# Patient Record
Sex: Male | Born: 1985
Health system: Southern US, Community
[De-identification: ages and names within clinical notes are randomized; demographics above are authoritative.]

## PROBLEM LIST (undated history)

## (undated) DIAGNOSIS — J40 Bronchitis, not specified as acute or chronic: Secondary | ICD-10-CM

## (undated) DIAGNOSIS — K8020 Calculus of gallbladder without cholecystitis without obstruction: Secondary | ICD-10-CM

## (undated) HISTORY — PX: APPENDECTOMY: SHX54

## (undated) HISTORY — PX: CHOLECYSTECTOMY: SHX55

---

## 2010-01-24 ENCOUNTER — Emergency Department (HOSPITAL_BASED_OUTPATIENT_CLINIC_OR_DEPARTMENT_OTHER)
Admission: EM | Admit: 2010-01-24 | Discharge: 2010-01-24 | Payer: Self-pay | Source: Home / Self Care | Admitting: Emergency Medicine

## 2010-05-27 ENCOUNTER — Emergency Department (HOSPITAL_BASED_OUTPATIENT_CLINIC_OR_DEPARTMENT_OTHER)
Admission: EM | Admit: 2010-05-27 | Discharge: 2010-05-27 | Disposition: A | Discharge status: Home / Self Care | Payer: BC Managed Care – PPO | Attending: Emergency Medicine | Admitting: Emergency Medicine

## 2010-05-27 DIAGNOSIS — R109 Unspecified abdominal pain: Secondary | ICD-10-CM | POA: Insufficient documentation

## 2010-05-27 DIAGNOSIS — IMO0002 Reserved for concepts with insufficient information to code with codable children: Secondary | ICD-10-CM | POA: Insufficient documentation

## 2010-05-27 DIAGNOSIS — Y9289 Other specified places as the place of occurrence of the external cause: Secondary | ICD-10-CM | POA: Insufficient documentation

## 2010-05-27 DIAGNOSIS — X503XXA Overexertion from repetitive movements, initial encounter: Secondary | ICD-10-CM | POA: Insufficient documentation

## 2010-05-27 DIAGNOSIS — F17200 Nicotine dependence, unspecified, uncomplicated: Secondary | ICD-10-CM | POA: Insufficient documentation

## 2010-08-17 ENCOUNTER — Emergency Department (HOSPITAL_BASED_OUTPATIENT_CLINIC_OR_DEPARTMENT_OTHER)
Admission: EM | Admit: 2010-08-17 | Discharge: 2010-08-17 | Disposition: A | Discharge status: Home / Self Care | Payer: BC Managed Care – PPO | Attending: Emergency Medicine | Admitting: Emergency Medicine

## 2010-08-17 ENCOUNTER — Encounter: Payer: Self-pay | Admitting: *Deleted

## 2010-08-17 ENCOUNTER — Emergency Department (INDEPENDENT_AMBULATORY_CARE_PROVIDER_SITE_OTHER): Payer: BC Managed Care – PPO

## 2010-08-17 DIAGNOSIS — R05 Cough: Secondary | ICD-10-CM | POA: Insufficient documentation

## 2010-08-17 DIAGNOSIS — R059 Cough, unspecified: Secondary | ICD-10-CM | POA: Insufficient documentation

## 2010-08-17 DIAGNOSIS — J40 Bronchitis, not specified as acute or chronic: Secondary | ICD-10-CM | POA: Insufficient documentation

## 2010-08-17 DIAGNOSIS — J029 Acute pharyngitis, unspecified: Secondary | ICD-10-CM | POA: Insufficient documentation

## 2010-08-17 DIAGNOSIS — Z87891 Personal history of nicotine dependence: Secondary | ICD-10-CM

## 2010-08-17 DIAGNOSIS — J3489 Other specified disorders of nose and nasal sinuses: Secondary | ICD-10-CM

## 2010-08-17 MED ORDER — DOXYCYCLINE HYCLATE 100 MG PO CAPS: 100.0000 mg | ORAL_CAPSULE | Freq: Two times a day (BID) | ORAL | Status: AC

## 2010-08-17 MED ORDER — ALBUTEROL SULFATE HFA 108 (90 BASE) MCG/ACT IN AERS: 1.0000 | INHALATION_SPRAY | Freq: Four times a day (QID) | RESPIRATORY_TRACT | Status: DC | PRN

## 2010-08-17 NOTE — ED Provider Notes (Signed)
History     CSN: [id number] Arrival date & time: 08/17/2010 10:02 AM  Chief Complaint  Patient presents with  . Sore Throat  . Cough   Patient is a 25 y.o. male presenting with pharyngitis and cough. The history is provided by the patient. No language interpreter was used.  Sore Throat This is a new problem. The current episode started more than 1 week ago. The problem occurs constantly. The problem has not changed since onset.Pertinent negatives include no chest pain, no abdominal pain, no headaches and no shortness of breath. The symptoms are aggravated by nothing. The symptoms are relieved by nothing. He has tried nothing for the symptoms. The treatment provided no relief.  Cough Pertinent negatives include no chest pain, no chills, no headaches, no shortness of breath and no wheezing.  No f/c/r. No CP, DOE, no SOB. No n/v/d.  No long car trips or plane trips, no swelling of the lower extremities.   History reviewed. No pertinent past medical history.  History reviewed. No pertinent past surgical history.  History reviewed. No pertinent family history.  History  Substance Use Topics  . Smoking status: Current Some Day Smoker  . Smokeless tobacco: Former Neurosurgeon  . Alcohol Use: Yes     occ      Review of Systems  Constitutional: Negative for fever, chills and activity change.  HENT: Negative for congestion, facial swelling, neck pain, neck stiffness and tinnitus.   Respiratory: Positive for cough. Negative for shortness of breath, wheezing and stridor.   Cardiovascular: Negative for chest pain.  Gastrointestinal: Negative for abdominal pain and abdominal distention.  Genitourinary: Negative for difficulty urinating.  Musculoskeletal: Negative.   Skin: Negative.   Neurological: Negative for headaches.  Hematological: Negative.   Psychiatric/Behavioral: Negative.     Physical Exam  BP 129/76  Pulse 73  Temp(Src) 98.4 F (36.9 C) (Oral)  Resp 18  SpO2 100%  Physical  Exam  Constitutional: He is oriented to person, place, and time. He appears well-developed and well-nourished. No distress.  HENT:  Head: Normocephalic and atraumatic.  Mouth/Throat: Oropharynx is clear and moist. No oropharyngeal exudate.  Eyes: EOM are normal. Pupils are equal, round, and reactive to light.  Neck: Normal range of motion. Neck supple.  Cardiovascular: Normal rate and regular rhythm.   Pulmonary/Chest: Effort normal and breath sounds normal. No respiratory distress. He has no wheezes. He has no rales.  Abdominal: Soft. Bowel sounds are normal. He exhibits no distension.  Musculoskeletal: Normal range of motion.  Neurological: He is alert and oriented to person, place, and time.  Skin: Skin is warm and dry.  Psychiatric: He has a normal mood and affect.    ED Course  Procedures  MDM Return for fevers, chills, inability to swallow, worsening cough or weakness or any concerns.  Follow up with your family doctor      Tysin Salada K Andreya Lacks-Rasch, MD 08/17/10 1122

## 2010-08-20 ENCOUNTER — Emergency Department (HOSPITAL_BASED_OUTPATIENT_CLINIC_OR_DEPARTMENT_OTHER)
Admission: EM | Admit: 2010-08-20 | Discharge: 2010-08-20 | Disposition: A | Discharge status: Home / Self Care | Payer: BC Managed Care – PPO | Attending: Emergency Medicine | Admitting: Emergency Medicine

## 2010-08-20 ENCOUNTER — Encounter (HOSPITAL_BASED_OUTPATIENT_CLINIC_OR_DEPARTMENT_OTHER): Payer: Self-pay

## 2010-08-20 DIAGNOSIS — J069 Acute upper respiratory infection, unspecified: Secondary | ICD-10-CM | POA: Insufficient documentation

## 2010-08-20 HISTORY — DX: Bronchitis, not specified as acute or chronic: J40

## 2010-08-20 LAB — DIFFERENTIAL
Basophils Absolute: 0 10*3/uL (ref 0.0–0.1)
Eosinophils Absolute: 0.7 10*3/uL (ref 0.0–0.7)
Eosinophils Relative: 13 % — ABNORMAL HIGH (ref 0–5)

## 2010-08-20 LAB — CBC
MCH: 31.4 pg (ref 26.0–34.0)
MCV: 88.6 fL (ref 78.0–100.0)
Platelets: 120 10*3/uL — ABNORMAL LOW (ref 150–400)
RDW: 11.7 % (ref 11.5–15.5)
WBC: 5.3 10*3/uL (ref 4.0–10.5)

## 2010-08-20 MED ORDER — BENZONATATE 100 MG PO CAPS: 100.0000 mg | ORAL_CAPSULE | Freq: Three times a day (TID) | ORAL | Status: AC

## 2010-08-20 NOTE — ED Notes (Signed)
Difficulty breathing and cough.  Seen here Tuesday and diagnosed with Bronchitis.

## 2010-08-24 NOTE — ED Provider Notes (Signed)
History     CSN: [id number] Arrival date & time: 08/20/2010  4:26 PM  No chief complaint on file.  HPI Comments: Patient seen here on 8/7 for same.   He started using albuterol and doxycycline but notes he has actually coughed more though his symptoms are otherwise improved.  He does continue to be short of breath but also reports that this is still an improvement.  CXR at previous visit showed no PNA.  Patient is a 25 y.o. male presenting with cough. The history is provided by the patient. No language interpreter was used.  Cough This is a recurrent problem. The current episode started more than 2 days ago. The problem occurs hourly. The problem has not changed since onset.The cough is non-productive. There has been no fever. Associated symptoms include headaches and shortness of breath. The treatment provided no relief. He is a smoker. His past medical history is significant for bronchitis.    Past Medical History  Diagnosis Date  . Bronchitis     Past Surgical History  Procedure Date  . Appendectomy     History reviewed. No pertinent family history.  History  Substance Use Topics  . Smoking status: Current Some Day Smoker -- 0.5 packs/day  . Smokeless tobacco: Former Neurosurgeon  . Alcohol Use: Yes     occ      Review of Systems  Constitutional: Negative.   Eyes: Negative.   Respiratory: Positive for cough and shortness of breath.   Cardiovascular: Negative.   Gastrointestinal: Negative.   Genitourinary: Negative.   Musculoskeletal: Negative.   Skin: Negative.   Neurological: Positive for headaches.  Hematological: Negative.   Psychiatric/Behavioral: Negative.   All other systems reviewed and are negative.    Physical Exam  BP 117/66  Pulse 66  Temp(Src) 98.2 F (36.8 C) (Oral)  Resp 18  Ht 6' (1.829 m)  Wt 175 lb (79.379 kg)  BMI 23.73 kg/m2  SpO2 97%  Physical Exam  Nursing note and vitals reviewed. Constitutional: He is oriented to person, place, and  time. He appears well-developed and well-nourished. No distress.  HENT:  Head: Normocephalic and atraumatic.  Mouth/Throat: Oropharynx is clear and moist. No oropharyngeal exudate.  Eyes: Conjunctivae and EOM are normal. Pupils are equal, round, and reactive to light.  Neck: Normal range of motion.  Cardiovascular: Normal rate and regular rhythm.  Exam reveals no gallop and no friction rub.   No murmur heard. Pulmonary/Chest: Effort normal and breath sounds normal. No respiratory distress. He has no wheezes. He has no rales.  Abdominal: Soft. Bowel sounds are normal. He exhibits no distension. There is no tenderness. There is no rebound and no guarding.  Musculoskeletal: Normal range of motion. He exhibits no edema and no tenderness.  Neurological: He is alert and oriented to person, place, and time. No cranial nerve deficit. He exhibits normal muscle tone. Coordination normal.  Skin: Skin is warm and dry. No rash noted.  Psychiatric: He has a normal mood and affect.    ED Course  Procedures  MDM Patient had not worsened from his previous visit and actually described improvement.  He was concerned that he continued to have shortness of breath and cough.  CBC demonstrated no anemia or signs of other process that might indicate an underlying ailment such as leukemia or lymphoma.  Patient and I discussed that albuterol though it eases chest tightness can often actually increase coughing.  Patient was less concerned once he learned this.  I did prescribe  tessalon perles for his cough and told him to continue his antibiotics.  Patient was comfortable with discharge home and was discharged in good condition.  A: 25 yo M continuing to recover from bronchitis with cough.  P: As per above.     Cyndra Numbers, MD 08/24/10 [phone]

## 2012-01-07 ENCOUNTER — Encounter (HOSPITAL_BASED_OUTPATIENT_CLINIC_OR_DEPARTMENT_OTHER): Payer: Self-pay | Admitting: *Deleted

## 2012-01-07 ENCOUNTER — Emergency Department (HOSPITAL_BASED_OUTPATIENT_CLINIC_OR_DEPARTMENT_OTHER)
Admission: EM | Admit: 2012-01-07 | Discharge: 2012-01-08 | Disposition: A | Discharge status: Home / Self Care | Payer: BC Managed Care – PPO | Attending: Emergency Medicine | Admitting: Emergency Medicine

## 2012-01-07 DIAGNOSIS — R197 Diarrhea, unspecified: Secondary | ICD-10-CM | POA: Insufficient documentation

## 2012-01-07 DIAGNOSIS — F17200 Nicotine dependence, unspecified, uncomplicated: Secondary | ICD-10-CM | POA: Insufficient documentation

## 2012-01-07 DIAGNOSIS — R112 Nausea with vomiting, unspecified: Secondary | ICD-10-CM | POA: Insufficient documentation

## 2012-01-07 DIAGNOSIS — K2990 Gastroduodenitis, unspecified, without bleeding: Secondary | ICD-10-CM | POA: Insufficient documentation

## 2012-01-07 DIAGNOSIS — K2970 Gastritis, unspecified, without bleeding: Secondary | ICD-10-CM | POA: Insufficient documentation

## 2012-01-07 DIAGNOSIS — IMO0002 Reserved for concepts with insufficient information to code with codable children: Secondary | ICD-10-CM | POA: Insufficient documentation

## 2012-01-07 DIAGNOSIS — Z8709 Personal history of other diseases of the respiratory system: Secondary | ICD-10-CM | POA: Insufficient documentation

## 2012-01-07 DIAGNOSIS — Z8719 Personal history of other diseases of the digestive system: Secondary | ICD-10-CM | POA: Insufficient documentation

## 2012-01-07 HISTORY — DX: Calculus of gallbladder without cholecystitis without obstruction: K80.20

## 2012-01-07 LAB — CBC WITH DIFFERENTIAL/PLATELET
Basophils Absolute: 0 10*3/uL (ref 0.0–0.1)
Eosinophils Absolute: 0.4 10*3/uL (ref 0.0–0.7)
Lymphocytes Relative: 8 % — ABNORMAL LOW (ref 12–46)
MCHC: 36.1 g/dL — ABNORMAL HIGH (ref 30.0–36.0)
Neutro Abs: 7.6 10*3/uL (ref 1.7–7.7)
Neutrophils Relative %: 82 % — ABNORMAL HIGH (ref 43–77)
RDW: 11.9 % (ref 11.5–15.5)

## 2012-01-07 LAB — URINALYSIS, ROUTINE W REFLEX MICROSCOPIC
Ketones, ur: NEGATIVE mg/dL
Leukocytes, UA: NEGATIVE
Nitrite: NEGATIVE
Protein, ur: NEGATIVE mg/dL
Urobilinogen, UA: 0.2 mg/dL (ref 0.0–1.0)

## 2012-01-07 LAB — COMPREHENSIVE METABOLIC PANEL
Albumin: 4.6 g/dL (ref 3.5–5.2)
Alkaline Phosphatase: 73 U/L (ref 39–117)
BUN: 20 mg/dL (ref 6–23)
Potassium: 4.8 mEq/L (ref 3.5–5.1)
Sodium: 141 mEq/L (ref 135–145)
Total Protein: 7.7 g/dL (ref 6.0–8.3)

## 2012-01-07 LAB — LIPASE, BLOOD: Lipase: 31 U/L (ref 11–59)

## 2012-01-07 NOTE — ED Notes (Signed)
Pt reports generalized body aches.

## 2012-01-07 NOTE — ED Notes (Signed)
Mid abd pain onset today. Vomited x 1. +nausea.

## 2012-01-07 NOTE — ED Provider Notes (Signed)
History   This chart was scribed for [name], MD scribed by Magnus Sinning. The patient was seen in room MH11/MH11 at 23:51    CSN: [id number]  Arrival date & time 01/07/12  2235    Chief Complaint  Patient presents with  . Abdominal Pain    (Consider location/radiation/quality/duration/timing/severity/associated sxs/prior treatment) Patient is a 26 y.o. male presenting with abdominal pain. The history is provided by the patient. No language interpreter was used.  Abdominal Pain The primary symptoms of the illness include abdominal pain, nausea and vomiting. The primary symptoms of the illness do not include fever, fatigue, diarrhea or dysuria. The current episode started 13 to 24 hours ago. The onset of the illness was gradual. The problem has not changed since onset. The abdominal pain began 13 to24 hours ago. The pain came on gradually. The abdominal pain has been unchanged since its onset. The abdominal pain is located in the epigastric region. The abdominal pain does not radiate. The abdominal pain is relieved by nothing.  The vomiting began yesterday. Vomiting occurred once. The emesis contains stomach contents.  The illness is associated with alcohol use. Symptoms associated with the illness do not include chills or constipation. Significant associated medical issues do not include PUD.   Blake Baldwin is a 26 y.o. male who presents to the Emergency Department complaining of steady and slightly gradually worsening moderate mid abd pain that began today with one episode of emesis,nausea today. No f/c/r No weight loss. The pt denies diarrhea, but says he does lack appetite. He provides exposure to babysitter that has had respiratory infection and two ear infections, as well as, exposure to sick one-year at home.  He states he had last episode of emesis three hours ago and that he had a mild HA previously that was relieved with tylenol.   Past Medical History  Diagnosis  Date  . Bronchitis   . Gallstones     Past Surgical History  Procedure Date  . Appendectomy     History reviewed. No pertinent family history.  History  Substance Use Topics  . Smoking status: Current Some Day Smoker -- 0.5 packs/day  . Smokeless tobacco: Former Neurosurgeon  . Alcohol Use: Yes     Comment: occ      Review of Systems  Constitutional: Negative for fever, chills, fatigue and unexpected weight change.  HENT: Positive for rhinorrhea. Negative for ear pain, congestion, neck pain and neck stiffness.   Eyes: Negative for itching.  Respiratory: Negative for cough.   Gastrointestinal: Positive for nausea, vomiting and abdominal pain. Negative for diarrhea and constipation.  Genitourinary: Negative for dysuria and flank pain.  Neurological: Negative for dizziness.  Hematological: Negative for adenopathy.  All other systems reviewed and are negative.    Allergies  Dilaudid  Home Medications   Current Outpatient Rx  Name  Route  Sig  Dispense  Refill  . ALBUTEROL SULFATE HFA 108 (90 BASE) MCG/ACT IN AERS   Inhalation   Inhale 1-2 puffs into the lungs every 6 (six) hours as needed for wheezing.   1 Inhaler   0     BP 123/69  Pulse 106  Temp 98.2 F (36.8 C) (Oral)  Resp 16  Ht 6' (1.829 m)  Wt 170 lb (77.111 kg)  BMI 23.06 kg/m2  SpO2 100%  Physical Exam  Nursing note and vitals reviewed. Constitutional: He is oriented to person, place, and time. He appears well-developed and well-nourished. No distress.  HENT:  Head:  Normocephalic and atraumatic.  Mouth/Throat: Oropharynx is clear and moist. No oropharyngeal exudate.  Eyes: Conjunctivae normal and EOM are normal. Pupils are equal, round, and reactive to light.  Neck: Normal range of motion. Neck supple. No tracheal deviation present.       No supraclavicular no epitrochlear LAN  Cardiovascular: Normal rate, regular rhythm, normal heart sounds and intact distal pulses.   Pulmonary/Chest: Effort  normal. No respiratory distress. He has no wheezes. He has no rales.  Abdominal: Soft. Bowel sounds are normal. He exhibits no distension. There is no tenderness. There is no rebound and no guarding.  Musculoskeletal: Normal range of motion. He exhibits no edema.  Lymphadenopathy:    He has no cervical adenopathy.  Neurological: He is alert and oriented to person, place, and time. He has normal reflexes. No sensory deficit.  Skin: Skin is warm and dry.  Psychiatric: He has a normal mood and affect. His behavior is normal.    ED Course  Procedures (including critical care time) DIAGNOSTIC STUDIES: Oxygen Saturation is 100% on room air, normal by my interpretation.    COORDINATION OF CARE:    Labs Reviewed  CBC WITH DIFFERENTIAL - Abnormal; Notable for the following:    MCHC 36.1 (*)     Platelets 117 (*)     Neutrophils Relative 82 (*)     Lymphocytes Relative 8 (*)     All other components within normal limits  COMPREHENSIVE METABOLIC PANEL - Abnormal; Notable for the following:    Glucose, Bld 102 (*)     All other components within normal limits  URINALYSIS, ROUTINE W REFLEX MICROSCOPIC  LIPASE, BLOOD   No results found.   No diagnosis found.    MDM  Informed of need to follow up with ongoing care and further testing regarding platelets. Exam vitals and lab reassuring.  Patient asks EDP at DC if symptoms could be caused by ETOH.  EDP informed patient that alcohol can cause symptoms of gastritis.  Patient must follow up with a PCP for ongoing care Return for worsening symptoms.  Patient verbalizes understanding and agrees to follow up.  D/c all alcohol.    I personally performed the services described in this documentation, which was scribed in my presence. The recorded information has been reviewed and is accurate.          Jasmine Awe, MD 01/08/12 [phone]

## 2012-01-08 ENCOUNTER — Emergency Department (HOSPITAL_BASED_OUTPATIENT_CLINIC_OR_DEPARTMENT_OTHER): Payer: BC Managed Care – PPO

## 2012-01-08 MED ORDER — ONDANSETRON HCL 4 MG/2ML IJ SOLN
4.0000 mg | Freq: Once | INTRAMUSCULAR | Status: AC
  Administered 2012-01-08: 4 mg via INTRAVENOUS
  Filled 2012-01-08: qty 2

## 2012-01-08 MED ORDER — GI COCKTAIL ~~LOC~~
30.0000 mL | Freq: Once | ORAL | Status: AC
  Administered 2012-01-08: 30 mL via ORAL
  Filled 2012-01-08: qty 30

## 2012-01-08 MED ORDER — OMEPRAZOLE 20 MG PO CPDR: 20.0000 mg | DELAYED_RELEASE_CAPSULE | Freq: Every day | ORAL | Status: DC

## 2012-01-08 MED ORDER — KETOROLAC TROMETHAMINE 30 MG/ML IJ SOLN
30.0000 mg | Freq: Once | INTRAMUSCULAR | Status: AC
  Administered 2012-01-08: 30 mg via INTRAVENOUS
  Filled 2012-01-08: qty 1

## 2012-01-08 MED ORDER — ONDANSETRON 8 MG PO TBDP: ORAL_TABLET | ORAL | Status: DC

## 2012-01-18 ENCOUNTER — Encounter: Payer: Self-pay | Admitting: Family

## 2012-01-18 ENCOUNTER — Ambulatory Visit (INDEPENDENT_AMBULATORY_CARE_PROVIDER_SITE_OTHER): Payer: BC Managed Care – PPO | Admitting: Family

## 2012-01-18 VITALS — BP 110/72 | HR 76 | Temp 97.6°F | Resp 16 | Ht 72.5 in | Wt 173.0 lb

## 2012-01-18 DIAGNOSIS — Z720 Tobacco use: Secondary | ICD-10-CM

## 2012-01-18 DIAGNOSIS — Z23 Encounter for immunization: Secondary | ICD-10-CM

## 2012-01-18 DIAGNOSIS — Z0000 Encounter for general adult medical examination without abnormal findings: Secondary | ICD-10-CM

## 2012-01-18 DIAGNOSIS — D696 Thrombocytopenia, unspecified: Secondary | ICD-10-CM

## 2012-01-18 DIAGNOSIS — D3190 Benign neoplasm of unspecified part of unspecified eye: Secondary | ICD-10-CM

## 2012-01-18 DIAGNOSIS — F17200 Nicotine dependence, unspecified, uncomplicated: Secondary | ICD-10-CM

## 2012-01-18 DIAGNOSIS — Z87891 Personal history of nicotine dependence: Secondary | ICD-10-CM | POA: Insufficient documentation

## 2012-01-18 LAB — LIPID PANEL: HDL: 34 mg/dL — ABNORMAL LOW (ref >39)

## 2012-01-18 LAB — IRON AND TIBC
%SAT: 62 % — ABNORMAL HIGH (ref 20–55)
TIBC: 293 ug/dL (ref 215–435)
UIBC: 111 ug/dL — ABNORMAL LOW (ref 125–400)

## 2012-01-18 NOTE — Assessment & Plan Note (Signed)
Tdap today, discussed importance of healthy diet.  Obtain fasting labs.

## 2012-01-18 NOTE — Assessment & Plan Note (Signed)
Appears chronic.  Refer to hematology for further evaluation.  Mother requests that we check iron level today.

## 2012-01-18 NOTE — Patient Instructions (Addendum)
Please complete your lab work prior to leaving.  You will be contacted about your referral to hematology.  Please let us know if you have not heard back within 1 week about your referral. Follow up as needed.

## 2012-01-18 NOTE — Assessment & Plan Note (Signed)
Pt was counseled to quit smoking. He is currently not motivated to quit.

## 2012-01-18 NOTE — Progress Notes (Signed)
Subjective:    Patient ID: Blake Baldwin, male    DOB: [birth year], 27 y.o.   MRN: [id number]  HPI  Blake Baldwin is a 27 yr old male who presents today to establish care.    Pt went Saturday to ED due to vomitting.  Returned on Sunday for same. Diagnosed with gastroenteritis.  Reports symptoms resolved.  Incidental finding during ED visit of low platelet count.  Low Platelet count-  117K in ED.  Was checked 1 year ago and it was 120K.  Mother who comes today with the patient reports that her sister is currently undergoing evaluation for same.  Was followed by GI as a teenager for ? Crohns.  Per mother this was never confirmed. He denies GI issues as an adult outside of his recent gastroenteritis.  Preventative-  Reports that his diet is not healthy.   He declines flu shot. Due for Tetanus- agreeable to this He is active in his work- works for [employer], no formal exercise.  Tobacco abuse- Not motivated to quit  Review of Systems  Constitutional: Negative for unexpected weight change.  HENT: Negative for hearing loss.   Eyes: Negative for visual disturbance.  Respiratory:       "smokers cough"  Cardiovascular: Negative for leg swelling.  Gastrointestinal: Negative for nausea, vomiting and diarrhea.  Genitourinary: Negative for dysuria and frequency.  Musculoskeletal: Negative for arthralgias.  Skin: Negative for rash.  Neurological:       Occasional headaches  Hematological: Negative for adenopathy.  Psychiatric/Behavioral:       Denies anxiety, denies depression   Past Medical History  Diagnosis Date  . Bronchitis   . Gallstones     History   Social History  . Marital Status: Married    Spouse Name: N/A    Number of Children: N/A  . Years of Education: N/A   Occupational History  . Not on file.   Social History Main Topics  . Smoking status: Current Some Day Smoker  . Smokeless tobacco: Former Neurosurgeon     Comment: 5-6 cigarettes daily  . Alcohol Use: Yes   Comment: occ  . Drug Use: No  . Sexually Active: Not Currently   Other Topics Concern  . 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 Appendectomy     Family History  Problem Relation Age of Onset  . Hypertension Mother   . Other Mother     hyperaldosteronism and padgett's disease  . Asthma Mother   . Heart disease Maternal Aunt   . Diabetes Maternal Aunt   . Diabetes Maternal Uncle   . Diabetes Cousin     Allergies  Allergen Reactions  . Dilaudid (Hydromorphone Hcl) Itching and Rash    Current Outpatient Prescriptions on File Prior to Visit  Medication Sig Dispense Refill  . albuterol (PROVENTIL HFA;VENTOLIN HFA) 108 (90 BASE) MCG/ACT inhaler Inhale 1-2 puffs into the lungs every 6 (six) hours as needed for wheezing.  1 Inhaler  0  . omeprazole (PRILOSEC) 20 MG capsule Take 1 capsule (20 mg total) by mouth daily.  30 capsule  0  . ondansetron (ZOFRAN ODT) 8 MG disintegrating tablet 8mg  ODT q8 hours prn nausea  4 tablet  0    BP 110/72  Pulse 76  Temp 97.6 F (36.4 C) (Oral)  Resp 16  Ht 6' 0.5" (1.842 m)  Wt 173 lb (78.472 kg)  BMI 23.14 kg/m2  SpO2 99%       Objective:   Physical Exam  Physical Exam  Constitutional: He is oriented to person, place, and time. He appears well-developed and well-nourished. No distress.  HENT:  Head: Normocephalic and atraumatic.  Right Ear: Tympanic membrane and ear canal normal.  Left Ear: Tympanic membrane and ear canal normal.  Mouth/Throat: Oropharynx is clear and moist.  Eyes: Pupils are equal, round, and reactive to light. No scleral icterus.  Neck: Normal range of motion. No thyromegaly present.  Cardiovascular: Normal rate and regular rhythm.   No murmur heard. Pulmonary/Chest: Effort normal and breath sounds normal. No respiratory distress. He has no wheezes. He has no rales. He exhibits no tenderness.  Abdominal: Soft.  Bowel sounds are normal. He exhibits no distension and no mass. There is no tenderness. There is no rebound and no guarding.  Musculoskeletal: He exhibits no edema.  Lymphadenopathy:    He has no cervical adenopathy.  Neurological: He is alert and oriented to person, place, and time.  He exhibits normal muscle tone. Coordination normal.  Skin: Skin is warm and dry.  Psychiatric: He has a normal mood and affect. His behavior is normal. Judgment and thought content normal.          Assessment & Plan:         Assessment & Plan:

## 2012-01-18 NOTE — Assessment & Plan Note (Signed)
Will refer to opthalmology

## 2012-01-20 ENCOUNTER — Encounter: Payer: Self-pay | Admitting: Family

## 2012-02-04 ENCOUNTER — Ambulatory Visit (HOSPITAL_COMMUNITY): Admission: AD | Admit: 2012-02-04 | Payer: BC Managed Care – PPO | Source: Ambulatory Visit

## 2012-02-04 ENCOUNTER — Emergency Department (HOSPITAL_BASED_OUTPATIENT_CLINIC_OR_DEPARTMENT_OTHER)
Admission: EM | Admit: 2012-02-04 | Discharge: 2012-02-05 | Disposition: A | Discharge status: Home / Self Care | Payer: BC Managed Care – PPO | Attending: Emergency Medicine | Admitting: Emergency Medicine

## 2012-02-04 ENCOUNTER — Encounter (HOSPITAL_BASED_OUTPATIENT_CLINIC_OR_DEPARTMENT_OTHER): Payer: Self-pay | Admitting: *Deleted

## 2012-02-04 ENCOUNTER — Emergency Department (HOSPITAL_COMMUNITY): Payer: BC Managed Care – PPO

## 2012-02-04 DIAGNOSIS — F17200 Nicotine dependence, unspecified, uncomplicated: Secondary | ICD-10-CM | POA: Insufficient documentation

## 2012-02-04 DIAGNOSIS — N509 Disorder of male genital organs, unspecified: Secondary | ICD-10-CM | POA: Insufficient documentation

## 2012-02-04 DIAGNOSIS — N50819 Testicular pain, unspecified: Secondary | ICD-10-CM

## 2012-02-04 NOTE — ED Notes (Signed)
Pt describes left testicular pain "after doing strenuous activities of that nature". Denies urinary s/s.

## 2012-02-04 NOTE — ED Provider Notes (Signed)
History     CSN: [id number]  Arrival date & time 02/04/12  2135   First MD Initiated Contact with Patient 02/04/12 2153      Chief Complaint  Patient presents with  . Testicle Pain    (Consider location/radiation/quality/duration/timing/severity/associated sxs/prior treatment) HPI Comments: Patient is a 27 year old male who presents with sudden onset of left testicular pain 2 days ago. Patient reports the pain started during intercourse and has been constant since the onset. The pain is dull, aching and moderate. No aggravating/alleviating factors. Associated symptoms include a "busted blood vessel" on his left side of scrotum. Patient did not try anything for pain relief.    Past Medical History  Diagnosis Date  . Bronchitis   . Gallstones     Past Surgical History  Procedure Date  . Appendectomy     Family History  Problem Relation Age of Onset  . Hypertension Mother   . Other Mother     hyperaldosteronism and padgett's disease  . Asthma Mother   . Heart disease Maternal Aunt   . Diabetes Maternal Aunt   . Diabetes Maternal Uncle   . Diabetes Cousin     History  Substance Use Topics  . Smoking status: Current Some Day Smoker  . Smokeless tobacco: Former Neurosurgeon     Comment: 5-6 cigarettes daily  . Alcohol Use: Yes     Comment: occ      Review of Systems  Genitourinary: Positive for testicular pain.  All other systems reviewed and are negative.    Allergies  Dilaudid  Home Medications   Current Outpatient Rx  Name  Route  Sig  Dispense  Refill  . ALBUTEROL SULFATE HFA 108 (90 BASE) MCG/ACT IN AERS   Inhalation   Inhale 1-2 puffs into the lungs every 6 (six) hours as needed for wheezing.   1 Inhaler   0     BP 131/75  Pulse 88  Temp 98.2 F (36.8 C) (Oral)  Resp 18  Ht 6' (1.829 m)  Wt 173 lb (78.472 kg)  BMI 23.46 kg/m2  SpO2 100%  Physical Exam  Nursing note and vitals reviewed. Constitutional: He appears well-developed and  well-nourished. No distress.  HENT:  Head: Normocephalic and atraumatic.  Eyes: Conjunctivae normal are normal.  Neck: Normal range of motion. Neck supple.  Cardiovascular: Normal rate and regular rhythm.  Exam reveals no gallop and no friction rub.   No murmur heard. Pulmonary/Chest: Effort normal and breath sounds normal. He has no wheezes. He has no rales. He exhibits no tenderness.  Abdominal: Soft. He exhibits no distension. There is no tenderness. There is no rebound.  Genitourinary: Penis normal.       No testicular swelling noted. Left side superficial varicosity noted. Scrotum non tender to palpation.   Musculoskeletal: Normal range of motion.  Neurological: He is alert.       Speech is goal-oriented. Moves limbs without ataxia.   Skin: Skin is warm and dry.  Psychiatric: He has a normal mood and affect.    ED Course  Procedures (including critical care time)  Labs Reviewed - No data to display No results found.   1. Testicular pain       MDM  10:32 PM Patient instructed to go to [facility] for a scrotal ultrasound to evaluate for testicular torsion. Charge nurse notified.        Emilia Beck, PA-C 02/05/12 0002

## 2012-02-04 NOTE — ED Notes (Signed)
Pt transported via private vehicle to [facility] for [place] & evaluation. Lynden Ang, RN [facility] notified.

## 2012-02-04 NOTE — ED Notes (Signed)
ED PAC at bedside. 

## 2012-02-04 NOTE — ED Notes (Signed)
Spoke with Dr Read Drivers regarding the reason the patient was sent here from [facility].  Stated we could get the ultrasound done while still waiting in the waiting room.

## 2012-02-05 LAB — URINALYSIS, ROUTINE W REFLEX MICROSCOPIC
Glucose, UA: NEGATIVE mg/dL
Ketones, ur: NEGATIVE mg/dL
Leukocytes, UA: NEGATIVE
Specific Gravity, Urine: 1.033 — ABNORMAL HIGH (ref 1.005–1.030)
pH: 6 (ref 5.0–8.0)

## 2012-02-05 NOTE — ED Notes (Signed)
Patient returned from US at this time.

## 2012-02-05 NOTE — ED Provider Notes (Signed)
Medical screening examination/treatment/procedure(s) were performed by non-physician practitioner and as supervising physician I was immediately available for consultation/collaboration.   Brittni Hult, MD 02/05/12 1533 

## 2012-02-05 NOTE — ED Provider Notes (Signed)
Pt seen in the ED at [facility] after he received his testicular ultrasound He has no signs of torsion on physical exam, no signs of inguinal hernia His [place] is negative.  He feels well for discharge Advised to avoid intercourse for a week and referred to urology Urine sent to lab but pt would like to be discharged.  He does not want gc/chlamydia swab   Joya Gaskins, MD 02/05/12 [phone]

## 2012-02-09 ENCOUNTER — Ambulatory Visit: Payer: BC Managed Care – PPO | Admitting: Hematology & Oncology

## 2012-02-09 ENCOUNTER — Ambulatory Visit: Payer: BC Managed Care – PPO

## 2012-02-09 ENCOUNTER — Other Ambulatory Visit: Payer: BC Managed Care – PPO | Admitting: Lab

## 2012-02-10 ENCOUNTER — Other Ambulatory Visit (HOSPITAL_BASED_OUTPATIENT_CLINIC_OR_DEPARTMENT_OTHER): Payer: BC Managed Care – PPO | Admitting: Lab

## 2012-02-10 ENCOUNTER — Ambulatory Visit: Payer: BC Managed Care – PPO

## 2012-02-10 ENCOUNTER — Ambulatory Visit (HOSPITAL_BASED_OUTPATIENT_CLINIC_OR_DEPARTMENT_OTHER): Payer: BC Managed Care – PPO | Admitting: Hematology & Oncology

## 2012-02-10 VITALS — BP 132/78 | HR 67 | Temp 97.0°F | Resp 18 | Ht 72.0 in | Wt 175.0 lb

## 2012-02-10 DIAGNOSIS — F1020 Alcohol dependence, uncomplicated: Secondary | ICD-10-CM

## 2012-02-10 DIAGNOSIS — D696 Thrombocytopenia, unspecified: Secondary | ICD-10-CM

## 2012-02-10 LAB — CBC WITH DIFFERENTIAL (CANCER CENTER ONLY)
BASO#: 0 10*3/uL (ref 0.0–0.2)
HCT: 42.7 % (ref 38.7–49.9)
HGB: 14.8 g/dL (ref 13.0–17.1)
LYMPH#: 2.4 10*3/uL (ref 0.9–3.3)
MONO#: 0.6 10*3/uL (ref 0.1–0.9)
NEUT#: 4.2 10*3/uL (ref 1.5–6.5)
NEUT%: 56.6 % (ref 40.0–80.0)
RBC: 4.68 10*6/uL (ref 4.20–5.70)
WBC: 7.4 10*3/uL (ref 4.0–10.0)

## 2012-02-10 NOTE — Progress Notes (Signed)
This office note has been dictated.

## 2012-02-11 NOTE — Progress Notes (Signed)
CC:   Sandford Craze, NP  DIAGNOSIS:  Mild thrombocytopenia.  HISTORY OF PRESENT ILLNESS:  Blake Baldwin is a nice 27 year old white gentleman.  He works for [employer].  He comes in with his mom.  He is actually a pretty funny guy.  He is totally into skateboarding.  He does have 2 kids.  He and his wife unfortunately recently separated.  He is followed by Sandford Craze.  He has been noted to have some mild thrombocytopenia going back to August, 2012.  His platelet count was 120,000.  White cell count was 5.3.  He is not anemic.  In late December 2013, his CBC showed a white cell count of 9.3, hemoglobin 16.8, hematocrit 46.5, and platelet count 117.  He had a white cell differential of 82 segs and 8 lymphocytes.  He recently was seen because of, I think, nausea and vomiting.  I think he also had a possible bronchitis.  He has had normal metabolic studies.  BUN and creatinine were 20 and 0.8.  LFTs are normal.  He had a normal lipases.  He was kindly referred to the [facility] because of the thrombocytopenia.  He has had no bleeding.  There has been no bruising.  He has had no weight loss or weight gain.  There has been no change in bowel or bladder habits.  He has not noticed any joint problems.  He does a skateboard quite a bit.  He seems to be doing pretty well with this.  He reports that does he binge drink.  He says that he drinks a 5th of whiskey 2 or 3 times a month.  He does do, I think, some marijuana.  He also ingests psychotropic mushrooms.  Again, we were asked to see him for the thrombocytopenia.  PAST MEDICAL HISTORY:  Relatively unremarkable.  ALLERGIES:  Dilaudid.  MEDICATIONS:  None.  SOCIAL HISTORY:  Remarkable for a 1/2 pack per day of tobacco use.  He does have the binge alcohol drinking with hard liquor.  There is some recreational drug use but nothing along the lines of cocaine, etc.  FAMILY HISTORY:  Remarkable for, I  think, an aunt who may have had thrombocytopenia.  REVIEW OF SYSTEMS:  As stated in the history of present illness.  No additional findings are noted on a 12-system review.  PHYSICAL EXAMINATION:  This is a well-developed, well-nourished white gentleman in no obvious distress.  Vital signs:  Temperature 97, pulse 67, respiratory rate 18, blood pressure 132/78.  Weight is 175. Head/neck:  Normocephalic, atraumatic skull.  There are no ocular or oral lesions.  There are no palpable cervical or supraclavicular lymph nodes.  Lungs:  Clear bilaterally.  Cardiac:  Regular rate and rhythm with a normal S1 and S2.  There are no murmurs, rubs or bruits. Abdomen:  Soft with good bowel sounds.  There is no palpable abdominal mass.  There is no palpable hepatosplenomegaly.  Back:  No tenderness over the spine, ribs, or hips.  Extremities:  No clubbing, cyanosis or edema.  Neurological:  No focal neurological deficits.  Skin:  No rashes, ecchymoses or petechia.  LABORATORY STUDIES:  White cell count is 7.4, hemoglobin 14.8, hematocrit 42.7, platelet count is 134.  MCV is 91.  Peripheral smear shows a normochromic, normocytic population of red blood cells.  There are no nucleated red blood cells.  There are no teardrop cells.  I see no schistocytes.  There are no spherocytes.  I see no rouleaux  formation.  White cells appear normal in morphology and maturation.  There are no immature myeloid cells.  There are no hypersegmented polys.  There have been no atypical lymphocytes. Platelets are minimally decreased in number.  He has numerous large platelets.  Platelets are well granulated.  IMPRESSION:  Blake Baldwin is a 27 year old white gentleman.  He has mild thrombocytopenia.  He has had this for 2 years, at least.  He is asymptomatic.  From my point of view, I have to suspect that his alcohol use is going to be the etiology.  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  He could be on the road to  develop cirrhosis.  I had a very long talk with him today about this.  I explained to him how alcohol affects the liver.  I told him how the alcohol affects the blood counts and how it is likely affecting his platelets.  Hopefully, he will understand this and cut back his alcohol consumption.  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  Again, he has very large platelets.  This would go along with immune thrombocytopenia.  I do not see any indication for a bone marrow biopsy.  I do not see any indication for putting him through additional testing.  I do not see anything that would suggest hepatitis or HIV-associated thrombocytopenia.  He may have had a viral syndrome recently.  This could have dropped his platelet count a little bit.  I want to see him back in about 3-4 months.  I think this would be reasonable for him so that we can reassess his platelets.  I spent a good hour or more with he and his mom.  They are both very nice.  I had a nice talk with them.    ______________________________ Josph Macho, M.D. PRE/MEDQ  D:  02/10/2012  T:  02/11/2012  Job:  [id number]

## 2012-02-25 ENCOUNTER — Other Ambulatory Visit: Payer: Self-pay

## 2012-04-02 ENCOUNTER — Telehealth: Payer: Self-pay | Admitting: Hematology & Oncology

## 2012-04-02 NOTE — Telephone Encounter (Signed)
Tried to call mothers cell phone it has been disconnected. Left message on home number moved 4-30 to 4-29

## 2012-05-08 ENCOUNTER — Other Ambulatory Visit: Payer: BC Managed Care – PPO | Admitting: Lab

## 2012-05-08 ENCOUNTER — Ambulatory Visit: Payer: BC Managed Care – PPO | Admitting: Hematology & Oncology

## 2012-05-09 ENCOUNTER — Other Ambulatory Visit: Payer: BC Managed Care – PPO | Admitting: Lab

## 2012-05-09 ENCOUNTER — Ambulatory Visit: Payer: BC Managed Care – PPO | Admitting: Hematology & Oncology

## 2012-05-09 ENCOUNTER — Telehealth: Payer: Self-pay | Admitting: Hematology & Oncology

## 2012-05-09 NOTE — Telephone Encounter (Signed)
Patient's mother called and resch missed 05/08/12 apt to 05/30/12

## 2012-05-30 ENCOUNTER — Other Ambulatory Visit (HOSPITAL_BASED_OUTPATIENT_CLINIC_OR_DEPARTMENT_OTHER): Payer: BC Managed Care – PPO | Admitting: Lab

## 2012-05-30 ENCOUNTER — Ambulatory Visit (HOSPITAL_BASED_OUTPATIENT_CLINIC_OR_DEPARTMENT_OTHER): Payer: BC Managed Care – PPO | Admitting: Medical

## 2012-05-30 VITALS — BP 124/74 | HR 74 | Temp 98.0°F | Resp 18 | Ht 71.0 in | Wt 167.0 lb

## 2012-05-30 DIAGNOSIS — D696 Thrombocytopenia, unspecified: Secondary | ICD-10-CM

## 2012-05-30 DIAGNOSIS — J069 Acute upper respiratory infection, unspecified: Secondary | ICD-10-CM

## 2012-05-30 LAB — CBC WITH DIFFERENTIAL (CANCER CENTER ONLY)
BASO%: 0.4 % (ref 0.0–2.0)
EOS%: 10.5 % — ABNORMAL HIGH (ref 0.0–7.0)
HCT: 43.4 % (ref 38.7–49.9)
LYMPH%: 40.9 % (ref 14.0–48.0)
MCHC: 35.7 g/dL (ref 32.0–35.9)
MCV: 91 fL (ref 82–98)
MONO%: 9 % (ref 0.0–13.0)
NEUT%: 39.2 % — ABNORMAL LOW (ref 40.0–80.0)
RDW: 11.9 % (ref 11.1–15.7)

## 2012-05-30 NOTE — Progress Notes (Signed)
DIAGNOSIS:  Mild thrombocytopenia.  CURRENT THERAPY: Observation  INTERIM HISTORY: Blake Baldwin presents today for an office followup visit.  His.  Overall he reports that he's doing relatively well.  He's not had any problems with any obvious or abnormal bleeding.  The last time we checked his platelets about 3 months ago 134,000.  The time before that it was 117,000 again were thinking along the lines of immune thrombocytopenia.  He does drink quite a bit which also could be related to the thrombocytopenia.  He reports that he feels like he has an upper respiratory infection that is bacterial.  Although, he's not reporting any fevers he is coughing up some mucus but reports that it is clear.  He's not having any chills.  He's not having any problems with chest pain or shortness of breath.  He is a smoker.  He does have a cough.  Go ahead and prescribe him a Z-Pak.  I didn't biopsy it is symptoms do not improve he needs to followup with his primary care physician.  His platelet count is 124,000 today.  Again, he is not symptomatic.  He's not anemic.  He's not having any nosebleeds gum bleeds, hemoptysis,  melena or hematochezia.  He reports a good appetite he is been no unintentional weight loss.  He denies any nausea, vomiting, diarrhea or constipation.  He denies any fevers, chills or night sweats.  He denies any chest pain, shortness of breath.  He denies any abdominal pain any back pain he denies any lower leg swelling.  He denies any obvious or abnormal bleeding.  He denies any headaches, visual changes or rashes.  He's not noticed any joint problems.  Review of Systems: Constitutional:Negative for malaise/fatigue, fever, chills, weight loss, diaphoresis, activity change, appetite change, and unexpected weight change.  HEENT: Negative for double vision, blurred vision, visual loss, ear pain, tinnitus, congestion, rhinorrhea, epistaxis sore throat or sinus disease, oral pain/lesion, tongue  soreness Respiratory: Negative for cough, chest tightness, shortness of breath, wheezing and stridor.  Cardiovascular: Negative for chest pain, palpitations, leg swelling, orthopnea, PND, DOE or claudication Gastrointestinal: Negative for nausea, vomiting, abdominal pain, diarrhea, constipation, blood in stool, melena, hematochezia, abdominal distention, anal bleeding, rectal pain, anorexia and hematemesis.  Genitourinary: Negative for dysuria, frequency, hematuria,  Musculoskeletal: Negative for myalgias, back pain, joint swelling, arthralgias and gait problem.  Skin: Negative for rash, color change, pallor and wound.  Neurological:. Negative for dizziness/light-headedness, tremors, seizures, syncope, facial asymmetry, speech difficulty, weakness, numbness, headaches and paresthesias.  Hematological: Negative for adenopathy. Does not bruise/bleed easily.  Psychiatric/Behavioral:  Negative for depression, no loss of interest in normal activity or change in sleep pattern.   Physical Exam: This is a 27 year old well-developed well-nourished white gentleman in no obvious distress Vitals: Temperature 98.0 degrees pulse 74 respirations 18 blood pressure 124/74 weight 167 pounds HEENT reveals a normocephalic, atraumatic skull, no scleral icterus, no oral lesions  Neck is supple without any cervical or supraclavicular adenopathy.  Lungs are clear to auscultation bilaterally. There are no wheezes, rales or rhonci Cardiac is regular rate and rhythm with a normal S1 and S2. There are no murmurs, rubs, or bruits.  Abdomen is soft with good bowel sounds, there is no palpable mass. There is no palpable hepatosplenomegaly. There is no palpable fluid wave.  Musculoskeletal no tenderness of the spine, ribs, or hips.  Extremities there are no clubbing, cyanosis, or edema.  Skin no petechia, purpura or ecchymosis Neurologic is nonfocal.  Laboratory Data: White  count 4.6 hemoglobin 15.5 hematocrit 43.4  platelets 124,000  Current Outpatient Prescriptions on File Prior to Visit  Medication Sig Dispense Refill  . chlorpheniramine-HYDROcodone (TUSSIONEX) 10-8 MG/5ML LQCR Take 5 mLs by mouth every 12 (twelve) hours as needed. cough      . doxycycline (VIBRA-TABS) 100 MG tablet Take 100 mg by mouth 2 (two) times daily. Bronchitis for 10 days beginning 02/03/12       No current facility-administered medications on file prior to visit.   Assessment/Plan: This is a pleasant 27 year old white gentleman with the following issues:  #1.  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  Again his alcohol consumption could also be the etiology of this.  His platelets are relatively stable.  He is not symptomatic.  I think for the time being we can continue just to observe him.  Currently there is not an indication for a bone marrow biopsy.  #2.  Upper respiratory infection.  He does not have signs of a bacterial infection.  He was requesting an antibiotic.  I did prescribe him a Z-Pak.  I advised him if his symptoms did not get better to followup with his primary care physician.  I also informed him, if this was a viral infection that it will  just have to wait out its course .  I advised him with certain over the counter medications.  #3.  Followup.  Mr. Blake Baldwin will follow back up with [place] in 3 months but before then should there be questions or concerns.

## 2012-06-05 ENCOUNTER — Emergency Department (HOSPITAL_BASED_OUTPATIENT_CLINIC_OR_DEPARTMENT_OTHER)
Admission: EM | Admit: 2012-06-05 | Discharge: 2012-06-06 | Disposition: A | Discharge status: Home / Self Care | Payer: BC Managed Care – PPO | Attending: Emergency Medicine | Admitting: Emergency Medicine

## 2012-06-05 ENCOUNTER — Encounter (HOSPITAL_BASED_OUTPATIENT_CLINIC_OR_DEPARTMENT_OTHER): Payer: Self-pay | Admitting: *Deleted

## 2012-06-05 DIAGNOSIS — F17200 Nicotine dependence, unspecified, uncomplicated: Secondary | ICD-10-CM | POA: Insufficient documentation

## 2012-06-05 DIAGNOSIS — K2970 Gastritis, unspecified, without bleeding: Secondary | ICD-10-CM

## 2012-06-05 DIAGNOSIS — R112 Nausea with vomiting, unspecified: Secondary | ICD-10-CM | POA: Insufficient documentation

## 2012-06-05 DIAGNOSIS — B349 Viral infection, unspecified: Secondary | ICD-10-CM

## 2012-06-05 DIAGNOSIS — Z8709 Personal history of other diseases of the respiratory system: Secondary | ICD-10-CM | POA: Insufficient documentation

## 2012-06-05 DIAGNOSIS — Z87442 Personal history of urinary calculi: Secondary | ICD-10-CM | POA: Insufficient documentation

## 2012-06-05 DIAGNOSIS — B9789 Other viral agents as the cause of diseases classified elsewhere: Secondary | ICD-10-CM | POA: Insufficient documentation

## 2012-06-05 LAB — URINALYSIS, ROUTINE W REFLEX MICROSCOPIC
Bilirubin Urine: NEGATIVE
Hgb urine dipstick: NEGATIVE
Ketones, ur: NEGATIVE mg/dL
Protein, ur: 30 mg/dL — AB
Urobilinogen, UA: 1 mg/dL (ref 0.0–1.0)

## 2012-06-05 LAB — URINE MICROSCOPIC-ADD ON

## 2012-06-05 NOTE — ED Notes (Signed)
Fever and vomiting woke him at 3 am.

## 2012-06-06 ENCOUNTER — Emergency Department (HOSPITAL_BASED_OUTPATIENT_CLINIC_OR_DEPARTMENT_OTHER): Payer: BC Managed Care – PPO

## 2012-06-06 LAB — CBC WITH DIFFERENTIAL/PLATELET
Basophils Absolute: 0 10*3/uL (ref 0.0–0.1)
Basophils Relative: 0 % (ref 0–1)
Eosinophils Absolute: 0 10*3/uL (ref 0.0–0.7)
Hemoglobin: 15.6 g/dL (ref 13.0–17.0)
MCH: 33.1 pg (ref 26.0–34.0)
MCHC: 36.8 g/dL — ABNORMAL HIGH (ref 30.0–36.0)
Monocytes Absolute: 0.2 10*3/uL (ref 0.1–1.0)
Monocytes Relative: 4 % (ref 3–12)
Neutro Abs: 4.9 10*3/uL (ref 1.7–7.7)
Neutrophils Relative %: 86 % — ABNORMAL HIGH (ref 43–77)
RDW: 11.9 % (ref 11.5–15.5)

## 2012-06-06 LAB — HEPATIC FUNCTION PANEL
ALT: 28 U/L (ref 0–53)
Alkaline Phosphatase: 66 U/L (ref 39–117)
Bilirubin, Direct: 0.1 mg/dL (ref 0.0–0.3)
Indirect Bilirubin: 0.4 mg/dL (ref 0.3–0.9)

## 2012-06-06 LAB — BASIC METABOLIC PANEL
CO2: 25 mEq/L (ref 19–32)
Calcium: 9.5 mg/dL (ref 8.4–10.5)
Creatinine, Ser: 0.7 mg/dL (ref 0.50–1.35)
GFR calc non Af Amer: >90 mL/min (ref >90)
Glucose, Bld: 104 mg/dL — ABNORMAL HIGH (ref 70–99)

## 2012-06-06 MED ORDER — IBUPROFEN 400 MG PO TABS
ORAL_TABLET | ORAL | Status: AC
  Administered 2012-06-06: 400 mg via ORAL
  Filled 2012-06-06: qty 1

## 2012-06-06 MED ORDER — IBUPROFEN 400 MG PO TABS: 400.0000 mg | ORAL_TABLET | Freq: Once | ORAL | Status: AC

## 2012-06-06 MED ORDER — SODIUM CHLORIDE 0.9 % IV BOLUS (SEPSIS)
1000.0000 mL | Freq: Once | INTRAVENOUS | Status: AC
  Administered 2012-06-06: 1000 mL via INTRAVENOUS

## 2012-06-06 MED ORDER — ONDANSETRON HCL 4 MG/2ML IJ SOLN
4.0000 mg | Freq: Once | INTRAMUSCULAR | Status: AC
  Administered 2012-06-06: 4 mg via INTRAVENOUS
  Filled 2012-06-06: qty 2

## 2012-06-06 MED ORDER — ONDANSETRON 8 MG PO TBDP: 8.0000 mg | ORAL_TABLET | Freq: Three times a day (TID) | ORAL | Status: DC | PRN

## 2012-06-06 NOTE — ED Provider Notes (Signed)
History     CSN: [id number]  Arrival date & time 06/05/12  2206   First MD Initiated Contact with Patient 06/06/12 0030      Chief Complaint  Patient presents with  . Fever    (Consider location/radiation/quality/duration/timing/severity/associated sxs/prior treatment) HPI Comments: Pt with hx of gall stones and appendicitis comes in with cc of fever, nausea. Pt reports waking up and feeling just bloated. As the day progressed, he continued to have no appetite, and started having nausea. Pt has had few emesis now, non bilious and non bloody - with poor appetite. Last BM was yday, passing flatus. Pt has no true abdominal pain. He started feeling warm, and thought he had a fever as well, and decided to come to the ER. No uti like sx. No chest pain, cough, dib, uri like sx. Denies drugs, alcohol abuse.  Patient is a 27 y.o. male presenting with fever. The history is provided by the patient.  Fever Associated symptoms: nausea and vomiting   Associated symptoms: no chest pain, no chills, no confusion, no cough, no diarrhea, no dysuria and no headaches     Past Medical History  Diagnosis Date  . Bronchitis   . Gallstones     Past Surgical History  Procedure Laterality Date  . Appendectomy      Family History  Problem Relation Age of Onset  . Hypertension Mother   . Other Mother     hyperaldosteronism and padgett's disease  . Asthma Mother   . Heart disease Maternal Aunt   . Diabetes Maternal Aunt   . Diabetes Maternal Uncle   . Diabetes Cousin     History  Substance Use Topics  . Smoking status: Current Some Day Smoker -- 0.50 packs/day    Types: Cigarettes  . Smokeless tobacco: Former Neurosurgeon     Comment: 5-6 cigarettes daily  . Alcohol Use: Yes     Comment: occ      Review of Systems  Constitutional: Positive for fever. Negative for chills, activity change and appetite change.  HENT: Negative for neck pain.   Eyes: Negative for visual disturbance.  Respiratory:  Negative for cough, chest tightness and shortness of breath.   Cardiovascular: Negative for chest pain.  Gastrointestinal: Positive for nausea and vomiting. Negative for abdominal pain, diarrhea and abdominal distention.  Genitourinary: Negative for dysuria, enuresis and difficulty urinating.  Musculoskeletal: Negative for arthralgias.  Neurological: Negative for dizziness, light-headedness and headaches.  Psychiatric/Behavioral: Negative for confusion.    Allergies  Dilaudid  Home Medications  No current outpatient prescriptions on file.  BP 118/73  Pulse 88  Temp(Src) 99.3 F (37.4 C) (Oral)  Resp 20  Ht 6\' 1"  (1.854 m)  Wt 166 lb 5 oz (75.439 kg)  BMI 21.95 kg/m2  SpO2 100%  Physical Exam  Nursing note and vitals reviewed. Constitutional: He is oriented to person, place, and time. He appears well-developed.  HENT:  Head: Normocephalic and atraumatic.  Eyes: Conjunctivae and EOM are normal. Pupils are equal, round, and reactive to light. No scleral icterus.  Neck: Normal range of motion. Neck supple.  Cardiovascular: Normal rate and regular rhythm.   Pulmonary/Chest: Effort normal and breath sounds normal.  Abdominal: Soft. Bowel sounds are normal. He exhibits no distension. There is no tenderness. There is no rebound and no guarding.  Neurological: He is alert and oriented to person, place, and time.  Skin: Skin is warm.    ED Course  Procedures (including critical care time)  Labs  Reviewed  URINALYSIS, ROUTINE W REFLEX MICROSCOPIC - Abnormal; Notable for the following:    pH 8.5 (*)    Protein, ur 30 (*)    All other components within normal limits  CBC WITH DIFFERENTIAL - Abnormal; Notable for the following:    MCHC 36.8 (*)    Platelets 119 (*)    Neutrophils Relative % 86 (*)    Lymphocytes Relative 9 (*)    Lymphs Abs 0.5 (*)    All other components within normal limits  BASIC METABOLIC PANEL - Abnormal; Notable for the following:    Glucose, Bld 104 (*)     All other components within normal limits  URINE MICROSCOPIC-ADD ON  LIPASE, BLOOD  HEPATIC FUNCTION PANEL   Dg Abd Acute W/chest  06/06/2012   *RADIOLOGY REPORT*  Clinical Data: Abdominal pain and nausea; mid chest pain. Vomiting.  ACUTE ABDOMEN SERIES (ABDOMEN 2 VIEW & CHEST 1 VIEW)  Comparison: Chest and abdominal radiographs performed 01/08/2012  Findings: The lungs are well-aerated and clear.  There is no evidence of focal opacification, pleural effusion or pneumothorax. The cardiomediastinal silhouette is within normal limits.  The visualized bowel gas pattern is unremarkable.  Scattered stool and air are seen within the colon; there is no evidence of small bowel dilatation to suggest obstruction.  No free intra-abdominal air is identified on the provided upright view.  No acute osseous abnormalities are seen; the sacroiliac joints are unremarkable in appearance.  IMPRESSION:  1.  Unremarkable bowel gas pattern; no free intra-abdominal air seen. 2.  No acute cardiopulmonary process identified.   Original Report Authenticated By: Tonia Ghent, M.D.     No diagnosis found.    MDM  DDx includes: Pancreatitis Hepatobiliary pathology including cholecystitis Gastritis/PUD SBO Viral syndrome  Pt with hx of GB disease comes in with cc of nausea, emesis and bloating with poor appetite. Hx of appendectomy - AAS ordered to r/o SBO.  Pt also has cholelithiasis - but his exam is completely benign - with no RUQ tenderness, infact no tenderness - so i dont see any utility in getting an ultrasound.  Will get GI labd, and basic labs as well.  2:44 AM All the labs are WNL, AAS is neg. Likely viral gastro for this young healthy male.       Derwood Kaplan, MD 06/06/12 [phone]

## 2012-06-22 ENCOUNTER — Encounter: Payer: Self-pay | Admitting: Family

## 2012-06-22 ENCOUNTER — Ambulatory Visit (INDEPENDENT_AMBULATORY_CARE_PROVIDER_SITE_OTHER): Payer: BC Managed Care – PPO | Admitting: Family

## 2012-06-22 VITALS — BP 118/70 | HR 68 | Temp 98.0°F | Resp 16 | Ht 72.5 in | Wt 167.1 lb

## 2012-06-22 DIAGNOSIS — K219 Gastro-esophageal reflux disease without esophagitis: Secondary | ICD-10-CM

## 2012-06-22 MED ORDER — OMEPRAZOLE MAGNESIUM 20 MG PO TBEC: 20.0000 mg | DELAYED_RELEASE_TABLET | Freq: Every day | ORAL | Status: DC

## 2012-06-22 NOTE — Patient Instructions (Addendum)
Please start omeprazole. Call if reflux symptoms worsen or if not improved in 1 month. Follow up as needed.     Diet for Gastroesophageal Reflux Disease, Adult Reflux (acid reflux) is when acid from your stomach flows up into the esophagus. When acid comes in contact with the esophagus, the acid causes irritation and soreness (inflammation) in the esophagus. When reflux happens often or so severely that it causes damage to the esophagus, it is called gastroesophageal reflux disease (GERD). Nutrition therapy can help ease the discomfort of GERD. FOODS OR DRINKS TO AVOID OR LIMIT  Smoking or chewing tobacco. Nicotine is one of the most potent stimulants to acid production in the gastrointestinal tract.  Caffeinated and decaffeinated coffee and black tea.  Regular or low-calorie carbonated beverages or energy drinks (caffeine-free carbonated beverages are allowed).   Strong spices, such as black pepper, white pepper, red pepper, cayenne, curry powder, and chili powder.  Peppermint or spearmint.  Chocolate.  High-fat foods, including meats and fried foods. Extra added fats including oils, butter, salad dressings, and nuts. Limit these to less than 8 tsp per day.  Fruits and vegetables if they are not tolerated, such as citrus fruits or tomatoes.  Alcohol.  Any food that seems to aggravate your condition. If you have questions regarding your diet, call your caregiver or a registered dietitian. OTHER THINGS THAT MAY HELP GERD INCLUDE:   Eating your meals slowly, in a relaxed setting.  Eating 5 to 6 small meals per day instead of 3 large meals.  Eliminating food for a period of time if it causes distress.  Not lying down until 3 hours after eating a meal.  Keeping the head of your bed raised 6 to 9 inches (15 to 23 cm) by using a foam wedge or blocks under the legs of the bed. Lying flat may make symptoms worse.  Being physically active. Weight loss may be helpful in reducing  reflux in overweight or obese adults.  Wear loose fitting clothing EXAMPLE MEAL PLAN This meal plan is approximately 2,000 calories based on https://www.[facility].org/ meal planning guidelines. Breakfast   cup cooked oatmeal.  1 cup strawberries.  1 cup low-fat milk.  1 oz almonds. Snack  1 cup cucumber slices.  6 oz yogurt (made from low-fat or fat-free milk). Lunch  2 slice whole-wheat bread.  2 oz sliced [place].  2 tsp mayonnaise.  1 cup blueberries.  1 cup snap peas. Snack  6 whole-wheat crackers.  1 oz string cheese. Dinner   cup brown rice.  1 cup mixed veggies.  1 tsp olive oil.  3 oz grilled fish. Document Released: 12/27/2004 Document Revised: 03/21/2011 Document Reviewed: 11/12/2010 [facility] Patient Information 2014 [place], [place].

## 2012-06-22 NOTE — Progress Notes (Signed)
  Subjective:    Patient ID: Blake Baldwin, male    DOB: 04-15-1985, 27 y.o.   MRN: [id number]  HPI  Blake Baldwin is a 27 yr old male who presents today for ED follow up.  He was seen in the ED on 5/27.  Records are reviewed.  Lab work again showed mild thrombocytopenia for which he is followed by hematology.  LFT are unremarkable.  He did not have any imaging performed at this visit.   He denies current abdominal pain.  He had "extreme nausea and temperature at the time" of his visit. Reports that these symptoms have resolved.  He denies current abdominal pain.  GERD- Reports that he has hx of acid reflux.  Reports daily symptoms.  Trying to avoid tomato based foods, and eat more grilled chicken instead of fried chicken.       Review of Systems    see HPI  Past Medical History  Diagnosis Date  . Bronchitis   . Gallstones     History   Social History  . Marital Status: Married    Spouse Name: N/A    Number of Children: N/A  . Years of Education: N/A   Occupational History  . Not on file.   Social History Main Topics  . Smoking status: Current Some Day Smoker -- 0.50 packs/day    Types: Cigarettes  . Smokeless tobacco: Former Neurosurgeon  . Alcohol Use: Yes     Comment: occ  . Drug Use: No  . Sexually Active: Not Currently   Other Topics Concern  . Not on file   Social History Narrative   In middle of divorce   Has 2 children   Works at [employer]    Past Surgical History  Procedure Laterality Date  . Appendectomy      Family History  Problem Relation Age of Onset  . Hypertension Mother   . Other Mother     hyperaldosteronism and padgett's disease  . Asthma Mother   . Heart disease Maternal Aunt   . Diabetes Maternal Aunt   . Diabetes Maternal Uncle   . Diabetes Cousin     Allergies  Allergen Reactions  . Dilaudid (Hydromorphone Hcl) Itching and Rash    No current outpatient prescriptions on file prior to visit.   No current facility-administered  medications on file prior to visit.    BP 118/70  Pulse 68  Temp(Src) 98 F (36.7 C) (Oral)  Resp 16  Ht 6' 0.5" (1.842 m)  Wt 167 lb 1.3 oz (75.787 kg)  BMI 22.34 kg/m2  SpO2 99%    Objective:   Physical Exam  Constitutional: He is oriented to person, place, and time. He appears well-developed and well-nourished. No distress.  Cardiovascular: Normal rate and regular rhythm.   No murmur heard. Pulmonary/Chest: Effort normal and breath sounds normal. No respiratory distress. He has no wheezes. He has no rales. He exhibits no tenderness.  Abdominal: Soft. Bowel sounds are normal. He exhibits no distension and no mass. There is no tenderness. There is no rebound and no guarding.  Musculoskeletal: He exhibits no edema.  Neurological: He is alert and oriented to person, place, and time.  Psychiatric: He has a normal mood and affect. His behavior is normal. Judgment and thought content normal.          Assessment & Plan:

## 2012-06-22 NOTE — Assessment & Plan Note (Signed)
Recommended that he start otc prilosec and avoid foods that worsen GERD symptoms- GERD diet provided today.

## 2012-08-16 ENCOUNTER — Telehealth: Payer: Self-pay | Admitting: Hematology & Oncology

## 2012-08-16 NOTE — Telephone Encounter (Signed)
Left message moved 8-20 to 8-28 tried to call mom's number but it is not working.

## 2012-08-29 ENCOUNTER — Ambulatory Visit: Payer: BC Managed Care – PPO | Admitting: Hematology & Oncology

## 2012-08-29 ENCOUNTER — Other Ambulatory Visit: Payer: BC Managed Care – PPO | Admitting: Lab

## 2012-09-06 ENCOUNTER — Ambulatory Visit (HOSPITAL_BASED_OUTPATIENT_CLINIC_OR_DEPARTMENT_OTHER): Payer: BC Managed Care – PPO | Admitting: Hematology & Oncology

## 2012-09-06 ENCOUNTER — Other Ambulatory Visit (HOSPITAL_BASED_OUTPATIENT_CLINIC_OR_DEPARTMENT_OTHER): Payer: BC Managed Care – PPO | Admitting: Lab

## 2012-09-06 VITALS — BP 120/70 | HR 62 | Temp 98.2°F | Resp 18 | Ht 70.0 in | Wt 172.0 lb

## 2012-09-06 DIAGNOSIS — D696 Thrombocytopenia, unspecified: Secondary | ICD-10-CM

## 2012-09-06 DIAGNOSIS — F17200 Nicotine dependence, unspecified, uncomplicated: Secondary | ICD-10-CM

## 2012-09-06 LAB — CBC WITH DIFFERENTIAL (CANCER CENTER ONLY)
BASO%: 0.5 % (ref 0.0–2.0)
EOS%: 14.9 % — ABNORMAL HIGH (ref 0.0–7.0)
LYMPH#: 2.5 10*3/uL (ref 0.9–3.3)
MCHC: 35.9 g/dL (ref 32.0–35.9)
MONO#: 0.5 10*3/uL (ref 0.1–0.9)
NEUT#: 1.7 10*3/uL (ref 1.5–6.5)
NEUT%: 29.9 % — ABNORMAL LOW (ref 40.0–80.0)
Platelets: 128 10*3/uL — ABNORMAL LOW (ref 145–400)
RDW: 11.7 % (ref 11.1–15.7)
WBC: 5.6 10*3/uL (ref 4.0–10.0)

## 2012-09-06 LAB — CHCC SATELLITE - SMEAR

## 2012-09-06 NOTE — Progress Notes (Signed)
This office note has been dictated.

## 2012-09-07 NOTE — Progress Notes (Signed)
DIAGNOSIS:  Chronic thrombocytopenia.  CURRENT THERAPY:  Observation.  INTERIM HISTORY:  Blake Baldwin comes in for his followup.  He is doing fairly well.  He is still working for [employer].  He has had no problems with bleeding.  He has had no problems with weight loss or weight gain.  He has gained 5 pounds, but he says this is because that he is eating the wrong foods.  He is still smoking.  He is trying to cut back on this.  He does not drink all that much.  He says he may have had 4 beers in the past 3 months.  He has had no rashes.  There has been no pruritus.  There has been no change in bowel or bladder habits.  PHYSICAL EXAMINATION:  General:  This is a well-developed, well- nourished white gentleman in no obvious distress.  Vital signs: Temperature of 98.2, pulse 62, respiratory rate 18, blood pressure 120/70.  Weight is 172.  Head/Neck:  Normocephalic, atraumatic skull. There are no ocular or oral lesions.  There are no palpable cervical or supraclavicular lymph nodes.  Lungs:  Clear bilaterally.  Cardiac: Regular rate and rhythm with a normal S1, S2.  There are no murmurs, rubs or bruits.  Abdomen:  Soft.  He has good bowel sounds.  There is no fluid wave.  There is no palpable hepatosplenomegaly.  Extremities.  No clubbing, cyanosis or edema.  Neurologic:  No rashes, ecchymosis, or petechia.  LABORATORY STUDIES:  White cell count 5.6, hemoglobin 15.5, hematocrit 43.2, platelet count 128.  White cell differential shows 30 segs, 45 lymphs, 15 eosinophils.  IMPRESSION:  Blake Baldwin is a very nice 27 year old gentleman.  He has chronic thrombocytopenia.  His platelet count has really remained stable since we first started seeing him.  I have noticed that his eosinophils have gone up.  I am not sure as to why this is.  I did look at his blood smear under the microscope, but i did not see anything that was suspicious.  I think we can probably get him back in 6 months.  I  do not see that we need any blood work in between visits.  ADDENDUM:  I failed to mention that Blake Baldwin does have some issues with gallbladder.  He may consider having his gallbladder taken out. From my point of view, I do not see any problems with this.    ______________________________ Blake Baldwin, M.D. PRE/MEDQ  D:  09/06/2012  T:  09/07/2012  Job:  [id number]

## 2012-11-15 ENCOUNTER — Other Ambulatory Visit: Payer: Self-pay

## 2013-01-12 ENCOUNTER — Emergency Department (HOSPITAL_BASED_OUTPATIENT_CLINIC_OR_DEPARTMENT_OTHER)
Admission: EM | Admit: 2013-01-12 | Discharge: 2013-01-12 | Disposition: A | Discharge status: Home / Self Care | Payer: BC Managed Care – PPO | Attending: Emergency Medicine | Admitting: Emergency Medicine

## 2013-01-12 ENCOUNTER — Encounter (HOSPITAL_BASED_OUTPATIENT_CLINIC_OR_DEPARTMENT_OTHER): Payer: Self-pay | Admitting: Emergency Medicine

## 2013-01-12 DIAGNOSIS — H60399 Other infective otitis externa, unspecified ear: Secondary | ICD-10-CM | POA: Insufficient documentation

## 2013-01-12 DIAGNOSIS — Z8719 Personal history of other diseases of the digestive system: Secondary | ICD-10-CM | POA: Insufficient documentation

## 2013-01-12 DIAGNOSIS — H6092 Unspecified otitis externa, left ear: Secondary | ICD-10-CM

## 2013-01-12 DIAGNOSIS — Z79899 Other long term (current) drug therapy: Secondary | ICD-10-CM | POA: Insufficient documentation

## 2013-01-12 DIAGNOSIS — Z8709 Personal history of other diseases of the respiratory system: Secondary | ICD-10-CM | POA: Insufficient documentation

## 2013-01-12 DIAGNOSIS — F17200 Nicotine dependence, unspecified, uncomplicated: Secondary | ICD-10-CM | POA: Insufficient documentation

## 2013-01-12 DIAGNOSIS — J3489 Other specified disorders of nose and nasal sinuses: Secondary | ICD-10-CM | POA: Insufficient documentation

## 2013-01-12 MED ORDER — ONDANSETRON 4 MG PO TBDP: 4.0000 mg | ORAL_TABLET | Freq: Once | ORAL | Status: AC

## 2013-01-12 MED ORDER — NAPROXEN 375 MG PO TABS: 375.0000 mg | ORAL_TABLET | Freq: Two times a day (BID) | ORAL | Status: DC

## 2013-01-12 MED ORDER — ANTIPYRINE-BENZOCAINE 5.4-1.4 % OT SOLN
3.0000 [drp] | Freq: Once | OTIC | Status: AC
  Administered 2013-01-12: 3 [drp] via OTIC
  Filled 2013-01-12: qty 10

## 2013-01-12 MED ORDER — ONDANSETRON HCL 4 MG/2ML IJ SOLN: 4.0000 mg | Freq: Once | INTRAMUSCULAR | Status: DC

## 2013-01-12 MED ORDER — ONDANSETRON 4 MG PO TBDP
ORAL_TABLET | ORAL | Status: AC
  Administered 2013-01-12: 4 mg via ORAL
  Filled 2013-01-12: qty 1

## 2013-01-12 MED ORDER — IBUPROFEN 800 MG PO TABS
800.0000 mg | ORAL_TABLET | Freq: Once | ORAL | Status: AC
  Administered 2013-01-12: 800 mg via ORAL
  Filled 2013-01-12: qty 1

## 2013-01-12 MED ORDER — CIPROFLOXACIN-HYDROCORTISONE 0.2-1 % OT SUSP: 3.0000 [drp] | Freq: Two times a day (BID) | OTIC | Status: AC

## 2013-01-12 NOTE — ED Notes (Signed)
Left ear pain since this AM, denies drainage or fever

## 2013-01-12 NOTE — Discharge Instructions (Signed)
If you were given medicines take as directed.  If you are on coumadin or contraceptives realize their levels and effectiveness is altered by many different medicines.  If you have any reaction (rash, tongues swelling, other) to the medicines stop taking and see a physician.   °Please follow up as directed and return to the ER or see a physician for new or worsening symptoms.  Thank you. ° ° °

## 2013-01-12 NOTE — ED Provider Notes (Signed)
CSN: [id number]     Arrival date & time 01/12/13  0600 History   First MD Initiated Contact with Patient 01/12/13 [phone]     Chief Complaint  Patient presents with  . Otalgia   (Consider location/radiation/quality/duration/timing/severity/associated sxs/prior Treatment) HPI Comments: 28 yo male with no immunosuppression or DM hx presents with left ear pain since this am.  Pt has had ear infection in the past.  No fevers, ha or vomiting.  Pain with moving ear.  No drainage.   Patient is a 28 y.o. male presenting with ear pain. The history is provided by the patient.  Otalgia Associated symptoms: congestion   Associated symptoms: no cough and no fever     Past Medical History  Diagnosis Date  . Bronchitis   . Gallstones    Past Surgical History  Procedure Laterality Date  . Appendectomy     Family History  Problem Relation Age of Onset  . Hypertension Mother   . Other Mother     hyperaldosteronism and padgett's disease  . Asthma Mother   . Heart disease Maternal Aunt   . Diabetes Maternal Aunt   . Diabetes Maternal Uncle   . Diabetes Cousin    History  Substance Use Topics  . Smoking status: Current Some Day Smoker -- 0.50 packs/day    Types: Cigarettes  . Smokeless tobacco: Former Systems developer  . Alcohol Use: Yes     Comment: occ    Review of Systems  Constitutional: Negative for fever and chills.  HENT: Positive for congestion and ear pain. Negative for facial swelling.   Eyes: Negative for visual disturbance.  Respiratory: Negative for cough and shortness of breath.   Cardiovascular: Negative for chest pain.    Allergies  Dilaudid  Home Medications   Current Outpatient Rx  Name  Route  Sig  Dispense  Refill  . ciprofloxacin-hydrocortisone (CIPRO HC) otic suspension   Left Ear   Place 3 drops into the left ear 2 (two) times daily.   10 mL   0   . naproxen (NAPROSYN) 375 MG tablet   Oral   Take 1 tablet (375 mg total) by mouth 2 (two) times daily.   10  tablet   0   . omeprazole (PRILOSEC OTC) 20 MG tablet   Oral   Take 1 tablet (20 mg total) by mouth daily.   30 tablet       BP 131/73  Pulse 84  Temp(Src) 97.8 F (36.6 C) (Oral)  Resp 18  Ht 6' (1.829 m)  Wt 179 lb (81.194 kg)  BMI 24.27 kg/m2  SpO2 100% Physical Exam  Nursing note and vitals reviewed. Constitutional: He appears well-developed and well-nourished.  HENT:  Head: Normocephalic and atraumatic.  Mild swelling left external auditory canal, pain with movement of ear, TM mild injected, no drainage, no mastoid tenderness  No trismus, uvular deviation, unilateral posterior pharyngeal edema or submandibular swelling.   Eyes: Conjunctivae are normal. Right eye exhibits no discharge. Left eye exhibits no discharge.  Neck: Normal range of motion. Neck supple. No tracheal deviation present.  Cardiovascular: Normal rate.   Pulmonary/Chest: Effort normal.  Musculoskeletal: He exhibits no edema.  Neurological: He is alert.  Skin: Skin is warm. No rash noted.  Psychiatric: He has a normal mood and affect.    ED Course  Procedures (including critical care time) Labs Review Labs Reviewed - No data to display Imaging Review No results found.  EKG Interpretation   None  MDM   1. External otitis, left    EO.  Well appearing. Improved in ED with auralgan drops and ibuprofen. No red flags.  Results and differential diagnosis were discussed with the patient. Close follow up outpatient was discussed, patient comfortable with the plan.   Diagnosis: EO left    Mariea Clonts, MD 01/12/13 [phone]

## 2013-02-17 ENCOUNTER — Emergency Department (HOSPITAL_BASED_OUTPATIENT_CLINIC_OR_DEPARTMENT_OTHER)
Admission: EM | Admit: 2013-02-17 | Discharge: 2013-02-17 | Disposition: A | Discharge status: Home / Self Care | Payer: BC Managed Care – PPO | Attending: Emergency Medicine | Admitting: Emergency Medicine

## 2013-02-17 ENCOUNTER — Encounter (HOSPITAL_BASED_OUTPATIENT_CLINIC_OR_DEPARTMENT_OTHER): Payer: Self-pay | Admitting: Emergency Medicine

## 2013-02-17 DIAGNOSIS — Z8709 Personal history of other diseases of the respiratory system: Secondary | ICD-10-CM | POA: Insufficient documentation

## 2013-02-17 DIAGNOSIS — M549 Dorsalgia, unspecified: Secondary | ICD-10-CM | POA: Insufficient documentation

## 2013-02-17 DIAGNOSIS — Z79899 Other long term (current) drug therapy: Secondary | ICD-10-CM | POA: Insufficient documentation

## 2013-02-17 DIAGNOSIS — Z8719 Personal history of other diseases of the digestive system: Secondary | ICD-10-CM | POA: Insufficient documentation

## 2013-02-17 DIAGNOSIS — F17200 Nicotine dependence, unspecified, uncomplicated: Secondary | ICD-10-CM | POA: Insufficient documentation

## 2013-02-17 DIAGNOSIS — Z791 Long term (current) use of non-steroidal anti-inflammatories (NSAID): Secondary | ICD-10-CM | POA: Insufficient documentation

## 2013-02-17 DIAGNOSIS — R51 Headache: Secondary | ICD-10-CM | POA: Insufficient documentation

## 2013-02-17 DIAGNOSIS — R112 Nausea with vomiting, unspecified: Secondary | ICD-10-CM | POA: Insufficient documentation

## 2013-02-17 LAB — COMPREHENSIVE METABOLIC PANEL
ALT: 34 U/L (ref 0–53)
AST: 21 U/L (ref 0–37)
Albumin: 4.2 g/dL (ref 3.5–5.2)
Alkaline Phosphatase: 75 U/L (ref 39–117)
BILIRUBIN TOTAL: 0.9 mg/dL (ref 0.3–1.2)
BUN: 18 mg/dL (ref 6–23)
CALCIUM: 9.2 mg/dL (ref 8.4–10.5)
CO2: 21 meq/L (ref 19–32)
CREATININE: 0.8 mg/dL (ref 0.50–1.35)
Chloride: 104 mEq/L (ref 96–112)
GFR calc Af Amer: >90 mL/min (ref >90)
Glucose, Bld: 121 mg/dL — ABNORMAL HIGH (ref 70–99)
Potassium: 4.3 mEq/L (ref 3.7–5.3)
Sodium: 142 mEq/L (ref 137–147)
Total Protein: 7.4 g/dL (ref 6.0–8.3)

## 2013-02-17 LAB — LIPASE, BLOOD: LIPASE: 25 U/L (ref 11–59)

## 2013-02-17 LAB — URINALYSIS, ROUTINE W REFLEX MICROSCOPIC
Bilirubin Urine: NEGATIVE
GLUCOSE, UA: NEGATIVE mg/dL
Hgb urine dipstick: NEGATIVE
KETONES UR: 15 mg/dL — AB
LEUKOCYTES UA: NEGATIVE
NITRITE: NEGATIVE
PROTEIN: NEGATIVE mg/dL
Specific Gravity, Urine: 1.026 (ref 1.005–1.030)
UROBILINOGEN UA: 1 mg/dL (ref 0.0–1.0)
pH: 8 (ref 5.0–8.0)

## 2013-02-17 LAB — CBC WITH DIFFERENTIAL/PLATELET
BASOS ABS: 0 10*3/uL (ref 0.0–0.1)
BASOS PCT: 0 % (ref 0–1)
EOS PCT: 1 % (ref 0–5)
Eosinophils Absolute: 0.2 10*3/uL (ref 0.0–0.7)
HEMATOCRIT: 45.4 % (ref 39.0–52.0)
HEMOGLOBIN: 16.1 g/dL (ref 13.0–17.0)
LYMPHS PCT: 3 % — AB (ref 12–46)
Lymphs Abs: 0.4 10*3/uL — ABNORMAL LOW (ref 0.7–4.0)
MCH: 31.4 pg (ref 26.0–34.0)
MCHC: 35.5 g/dL (ref 30.0–36.0)
MCV: 88.5 fL (ref 78.0–100.0)
MONO ABS: 0.4 10*3/uL (ref 0.1–1.0)
MONOS PCT: 3 % (ref 3–12)
Neutro Abs: 12.8 10*3/uL — ABNORMAL HIGH (ref 1.7–7.7)
Neutrophils Relative %: 94 % — ABNORMAL HIGH (ref 43–77)
Platelets: 142 10*3/uL — ABNORMAL LOW (ref 150–400)
RBC: 5.13 MIL/uL (ref 4.22–5.81)
RDW: 11.8 % (ref 11.5–15.5)
WBC: 13.7 10*3/uL — ABNORMAL HIGH (ref 4.0–10.5)

## 2013-02-17 MED ORDER — ONDANSETRON HCL 4 MG/2ML IJ SOLN
4.0000 mg | Freq: Once | INTRAMUSCULAR | Status: AC
  Administered 2013-02-17: 4 mg via INTRAVENOUS
  Filled 2013-02-17: qty 2

## 2013-02-17 MED ORDER — ONDANSETRON HCL 4 MG/2ML IJ SOLN
INTRAMUSCULAR | Status: AC
  Filled 2013-02-17: qty 2

## 2013-02-17 MED ORDER — PROMETHAZINE HCL 25 MG PO TABS: 25.0000 mg | ORAL_TABLET | Freq: Four times a day (QID) | ORAL | Status: DC | PRN

## 2013-02-17 MED ORDER — PROMETHAZINE HCL 25 MG/ML IJ SOLN
12.5000 mg | Freq: Once | INTRAMUSCULAR | Status: AC
  Administered 2013-02-17: 12.5 mg via INTRAVENOUS
  Filled 2013-02-17: qty 1

## 2013-02-17 MED ORDER — SODIUM CHLORIDE 0.9 % IV SOLN
1000.0000 mL | Freq: Once | INTRAVENOUS | Status: AC
  Administered 2013-02-17: 1000 mL via INTRAVENOUS

## 2013-02-17 NOTE — Discharge Instructions (Signed)
Be sure you are drinking plenty of fluids. Take the medication for nausea as needed. Follow up with your doctor or return here for worsening symptoms.

## 2013-02-17 NOTE — ED Provider Notes (Signed)
CSN: [id number]     Arrival date & time 02/17/13  1256 History   First MD Initiated Contact with Patient 02/17/13 1531     Chief Complaint  Patient presents with  . Emesis   (Consider location/radiation/quality/duration/timing/severity/associated sxs/prior Treatment) Patient is a 28 y.o. male presenting with vomiting. The history is provided by the patient.  Emesis Severity:  Moderate Duration:  8 hours Timing:  Sporadic Number of daily episodes:  More than 10 times Quality:  Stomach contents Feeding tolerance: nothing. Progression:  Worsening Chronicity:  New Recent urination:  Normal Relieved by:  Nothing Worsened by:  Liquids Ineffective treatments:  Liquids Associated symptoms: headaches   Associated symptoms: no abdominal pain (just feels sore), no chills, no cough, no diarrhea, no fever, no myalgias, no sore throat and no URI   Risk factors: no diabetes, no sick contacts, no suspect food intake and no travel to endemic areas  Recent alcohol use: occasional only. Abdominal surgery: appendectomy 8 years ago.    MAVRIC CORTRIGHT is a 28 y.o. male who presents to the ED with nausea and vomiting that started this morning. He denies fever or chills or diarrhea.  Past Medical History  Diagnosis Date  . Bronchitis   . Gallstones    Past Surgical History  Procedure Laterality Date  . Appendectomy     Family History  Problem Relation Age of Onset  . Hypertension Mother   . Other Mother     hyperaldosteronism and padgett's disease  . Asthma Mother   . Heart disease Maternal Aunt   . Diabetes Maternal Aunt   . Diabetes Maternal Uncle   . Diabetes Cousin    History  Substance Use Topics  . Smoking status: Current Some Day Smoker -- 0.50 packs/day    Types: Cigarettes  . Smokeless tobacco: Former Systems developer  . Alcohol Use: Yes     Comment: occ    Review of Systems  Constitutional: Negative for fever and chills.  HENT: Negative for sore throat.   Eyes: Negative for redness  and itching.  Respiratory: Negative for cough.   Cardiovascular: Negative for chest pain and leg swelling.  Gastrointestinal: Positive for nausea and vomiting. Negative for abdominal pain (just feels sore) and diarrhea.  Genitourinary: Negative for dysuria, urgency and frequency.  Musculoskeletal: Positive for back pain. Negative for myalgias.  Skin: Negative for rash.  Neurological: Positive for headaches. Negative for syncope and light-headedness.  Psychiatric/Behavioral: The patient is not nervous/anxious.     Allergies  Dilaudid  Home Medications   Current Outpatient Rx  Name  Route  Sig  Dispense  Refill  . naproxen (NAPROSYN) 375 MG tablet   Oral   Take 1 tablet (375 mg total) by mouth 2 (two) times daily.   10 tablet   0   . omeprazole (PRILOSEC OTC) 20 MG tablet   Oral   Take 1 tablet (20 mg total) by mouth daily.   30 tablet       BP 104/65  Pulse 95  Temp(Src) 97.8 F (36.6 C) (Oral)  Resp 18  Ht 6' (1.829 m)  Wt 172 lb (78.019 kg)  BMI 23.32 kg/m2  SpO2 100% Physical Exam  Nursing note and vitals reviewed. Constitutional: He is oriented to person, place, and time. He appears well-developed and well-nourished. No distress.  HENT:  Head: Normocephalic and atraumatic.  Eyes: EOM are normal.  Neck: Neck supple.  Cardiovascular: Normal rate.   Pulmonary/Chest: Effort normal.  Abdominal: Soft. There is no  tenderness.  Musculoskeletal: Normal range of motion.  Neurological: He is alert and oriented to person, place, and time. No cranial nerve deficit.  Skin: Skin is warm and dry.  Psychiatric: He has a normal mood and affect. His behavior is normal.   Results for orders placed during the hospital encounter of 02/17/13 (from the past 24 hour(s))  CBC WITH DIFFERENTIAL     Status: Abnormal   Collection Time    02/17/13  1:45 PM      Result Value Range   WBC 13.7 (*) 4.0 - 10.5 K/uL   RBC 5.13  4.22 - 5.81 MIL/uL   Hemoglobin 16.1  13.0 - 17.0 g/dL    HCT 45.4  39.0 - 52.0 %   MCV 88.5  78.0 - 100.0 fL   MCH 31.4  26.0 - 34.0 pg   MCHC 35.5  30.0 - 36.0 g/dL   RDW 11.8  11.5 - 15.5 %   Platelets 142 (*) 150 - 400 K/uL   Neutrophils Relative % 94 (*) 43 - 77 %   Neutro Abs 12.8 (*) 1.7 - 7.7 K/uL   Lymphocytes Relative 3 (*) 12 - 46 %   Lymphs Abs 0.4 (*) 0.7 - 4.0 K/uL   Monocytes Relative 3  3 - 12 %   Monocytes Absolute 0.4  0.1 - 1.0 K/uL   Eosinophils Relative 1  0 - 5 %   Eosinophils Absolute 0.2  0.0 - 0.7 K/uL   Basophils Relative 0  0 - 1 %   Basophils Absolute 0.0  0.0 - 0.1 K/uL  COMPREHENSIVE METABOLIC PANEL     Status: Abnormal   Collection Time    02/17/13  1:45 PM      Result Value Range   Sodium 142  137 - 147 mEq/L   Potassium 4.3  3.7 - 5.3 mEq/L   Chloride 104  96 - 112 mEq/L   CO2 21  19 - 32 mEq/L   Glucose, Bld 121 (*) 70 - 99 mg/dL   BUN 18  6 - 23 mg/dL   Creatinine, Ser 0.80  0.50 - 1.35 mg/dL   Calcium 9.2  8.4 - 10.5 mg/dL   Total Protein 7.4  6.0 - 8.3 g/dL   Albumin 4.2  3.5 - 5.2 g/dL   AST 21  0 - 37 U/L   ALT 34  0 - 53 U/L   Alkaline Phosphatase 75  39 - 117 U/L   Total Bilirubin 0.9  0.3 - 1.2 mg/dL   GFR calc non Af Amer >90  >90 mL/min   GFR calc Af Amer >90  >90 mL/min  LIPASE, BLOOD     Status: None   Collection Time    02/17/13  1:45 PM      Result Value Range   Lipase 25  11 - 59 U/L  URINALYSIS, ROUTINE W REFLEX MICROSCOPIC     Status: Abnormal   Collection Time    02/17/13  4:02 PM      Result Value Range   Color, Urine AMBER (*) YELLOW   APPearance CLEAR  CLEAR   Specific Gravity, Urine 1.026  1.005 - 1.030   pH 8.0  5.0 - 8.0   Glucose, UA NEGATIVE  NEGATIVE mg/dL   Hgb urine dipstick NEGATIVE  NEGATIVE   Bilirubin Urine NEGATIVE  NEGATIVE   Ketones, ur 15 (*) NEGATIVE mg/dL   Protein, ur NEGATIVE  NEGATIVE mg/dL   Urobilinogen, UA 1.0  0.0 - 1.0  mg/dL   Nitrite NEGATIVE  NEGATIVE   Leukocytes, UA NEGATIVE  NEGATIVE    ED Course  Procedures  IV hydration, Zofran  4 mg IV and Phenergan 12.5 mg. IV and patient feeling better.   MDM  28 y.o. male with nausea and vomiting that started earlier today. Improved with treatment here in the ED. Stable for discharge. Will treat with Phenergan Rx. He will return for worsening symptoms. Clear liquids and then advance to [employer].  Discussed with the patient and all questioned fully answered. He will call returnif any problems arise.    Medication List    TAKE these medications       promethazine 25 MG tablet  Commonly known as:  PHENERGAN  Take 1 tablet (25 mg total) by mouth every 6 (six) hours as needed for nausea or vomiting.      ASK your doctor about these medications       naproxen 375 MG tablet  Commonly known as:  NAPROSYN  Take 1 tablet (375 mg total) by mouth 2 (two) times daily.     omeprazole 20 MG tablet  Commonly known as:  PRILOSEC OTC  Take 1 tablet (20 mg total) by mouth daily.           [address] [place], [place] 02/18/13 [phone]

## 2013-02-17 NOTE — ED Notes (Signed)
Fluids offered.  Patient has tolerated a cup of gingerale without further vomiting noted.  Is resting quietly on his left side.  Mother remains at bedside.

## 2013-02-17 NOTE — ED Notes (Signed)
Patient began having N/V this AM. Weakness and abd pain. No one in the house is sick at this time.

## 2013-02-22 NOTE — ED Provider Notes (Signed)
Medical screening examination/treatment/procedure(s) were performed by non-physician practitioner and as supervising physician I was immediately available for consultation/collaboration.    Dot Lanes, MD 02/22/13 [phone]

## 2013-03-07 ENCOUNTER — Other Ambulatory Visit: Payer: BC Managed Care – PPO | Admitting: Lab

## 2013-03-07 ENCOUNTER — Ambulatory Visit: Payer: BC Managed Care – PPO | Admitting: Hematology & Oncology

## 2013-03-08 ENCOUNTER — Telehealth: Payer: Self-pay | Admitting: Hematology & Oncology

## 2013-03-08 NOTE — Telephone Encounter (Signed)
Left message for pt to call and schedule appointment °

## 2013-05-08 ENCOUNTER — Telehealth: Payer: Self-pay | Admitting: Family

## 2013-05-08 ENCOUNTER — Ambulatory Visit (INDEPENDENT_AMBULATORY_CARE_PROVIDER_SITE_OTHER): Payer: BC Managed Care – PPO | Admitting: Family

## 2013-05-08 ENCOUNTER — Encounter: Payer: Self-pay | Admitting: Family

## 2013-05-08 VITALS — BP 118/80 | HR 67 | Temp 97.7°F | Resp 18 | Ht 70.0 in | Wt 187.0 lb

## 2013-05-08 DIAGNOSIS — M259 Joint disorder, unspecified: Secondary | ICD-10-CM

## 2013-05-08 DIAGNOSIS — R2230 Localized swelling, mass and lump, unspecified upper limb: Secondary | ICD-10-CM | POA: Insufficient documentation

## 2013-05-08 DIAGNOSIS — D1721 Benign lipomatous neoplasm of skin and subcutaneous tissue of right arm: Secondary | ICD-10-CM | POA: Insufficient documentation

## 2013-05-08 NOTE — Assessment & Plan Note (Signed)
Discussed referral to surgeon for excision. He declines at this time and instead would like to proceed with ultrasound for further evaluation.

## 2013-05-08 NOTE — Telephone Encounter (Signed)
Relevant patient education assigned to patient using Emmi. ° °

## 2013-05-08 NOTE — Progress Notes (Signed)
   Subjective:    Patient ID: Blake Baldwin, male    DOB: [birth year], 28 y.o.   MRN: [id number]  HPI  Blake Baldwin is a 28 yr old male who presents today with chief complaint of "cyst" on right shoulder.  Reports that he first noticed it 3 weeks ago. Denies associated pain. Notes + right shoulder "click" with movement but no shoulder pain.    Review of Systems See HPI  Past Medical History  Diagnosis Date  . Bronchitis   . Gallstones     History   Social History  . Marital Status: Married    Spouse Name: N/A    Number of Children: N/A  . Years of Education: N/A   Occupational History  . Not on file.   Social History Main Topics  . Smoking status: Current Some Day Smoker -- 0.50 packs/day    Types: Cigarettes  . Smokeless tobacco: Former Systems developer  . Alcohol Use: Yes     Comment: occ  . Drug Use: No  . Sexual Activity: Not Currently   Other Topics Concern  . Not on file   Social History Narrative   In middle of divorce   Has 2 children   Works at [employer]    Past Surgical History  Procedure Laterality Date  . Appendectomy      Family History  Problem Relation Age of Onset  . Hypertension Mother   . Other Mother     hyperaldosteronism and padgett's disease  . Asthma Mother   . Heart disease Maternal Aunt   . Diabetes Maternal Aunt   . Diabetes Maternal Uncle   . Diabetes Cousin     Allergies  Allergen Reactions  . Dilaudid [Hydromorphone Hcl] Itching and Rash    Current Outpatient Prescriptions on File Prior to Visit  Medication Sig Dispense Refill  . omeprazole (PRILOSEC OTC) 20 MG tablet Take 1 tablet (20 mg total) by mouth daily.  30 tablet    . naproxen (NAPROSYN) 375 MG tablet Take 1 tablet (375 mg total) by mouth 2 (two) times daily.  10 tablet  0  . promethazine (PHENERGAN) 25 MG tablet Take 1 tablet (25 mg total) by mouth every 6 (six) hours as needed for nausea or vomiting.  30 tablet  0   No current facility-administered medications on  file prior to visit.    BP 118/80  Pulse 67  Temp(Src) 97.7 F (36.5 C) (Oral)  Resp 18  Ht 5\' 10"  (1.778 m)  Wt 187 lb (84.823 kg)  BMI 26.83 kg/m2  SpO2 99%       Objective:   Physical Exam  Constitutional: He appears well-developed and well-nourished. No distress.  Psychiatric:  Dorsal right shoulder- approx 1 inch long firm rubbery, non-tender mass. No obvious fluctuance          Assessment & Plan:

## 2013-05-08 NOTE — Patient Instructions (Signed)
Please schedule shoulder ultrasound on the first floor.  We will contact you with your results.

## 2013-05-10 ENCOUNTER — Other Ambulatory Visit: Payer: Self-pay | Admitting: Family

## 2013-05-10 ENCOUNTER — Ambulatory Visit (HOSPITAL_BASED_OUTPATIENT_CLINIC_OR_DEPARTMENT_OTHER)
Admission: RE | Admit: 2013-05-10 | Discharge: 2013-05-10 | Disposition: A | Discharge status: Home / Self Care | Payer: BC Managed Care – PPO | Source: Ambulatory Visit | Attending: Family | Admitting: Family

## 2013-05-10 DIAGNOSIS — R2230 Localized swelling, mass and lump, unspecified upper limb: Secondary | ICD-10-CM

## 2013-05-10 DIAGNOSIS — R229 Localized swelling, mass and lump, unspecified: Secondary | ICD-10-CM | POA: Insufficient documentation

## 2013-06-07 ENCOUNTER — Encounter: Payer: Self-pay | Admitting: Family

## 2013-06-07 ENCOUNTER — Ambulatory Visit (INDEPENDENT_AMBULATORY_CARE_PROVIDER_SITE_OTHER): Payer: BC Managed Care – PPO | Admitting: Family

## 2013-06-07 VITALS — BP 120/74 | HR 78 | Temp 97.8°F | Resp 16 | Ht 70.0 in

## 2013-06-07 DIAGNOSIS — M542 Cervicalgia: Secondary | ICD-10-CM

## 2013-06-07 DIAGNOSIS — M259 Joint disorder, unspecified: Secondary | ICD-10-CM

## 2013-06-07 DIAGNOSIS — R2230 Localized swelling, mass and lump, unspecified upper limb: Secondary | ICD-10-CM

## 2013-06-07 MED ORDER — CIPROFLOXACIN HCL 500 MG PO TABS: 500.0000 mg | ORAL_TABLET | Freq: Two times a day (BID) | ORAL | Status: DC

## 2013-06-07 MED ORDER — MELOXICAM 7.5 MG PO TABS: 7.5000 mg | ORAL_TABLET | Freq: Every day | ORAL | Status: DC

## 2013-06-07 NOTE — Assessment & Plan Note (Signed)
?   Mildly smaller in size today.  Likely lipoma, reassurance provided.  He thinks he may wish to have removed at some time but is not ready now. I have advised him that he should let us know if he would like referral to general surgeon in the future and we will arrange.

## 2013-06-07 NOTE — Patient Instructions (Signed)
Start meloxicam once daily as needed for neck pain. Follow up as needed.

## 2013-06-07 NOTE — Assessment & Plan Note (Signed)
Trial of meloxicam, heat prn. Follow up if symptoms worsen or if symptoms do not improve.

## 2013-06-07 NOTE — Progress Notes (Signed)
   Subjective:    Patient ID: Blake Baldwin, male    DOB: [birth year], 28 y.o.   MRN: [id number]  HPI   Mr. Bartoszek is here for FU for right shoulder mass. He has not noticed any changes in the mass since his visit 1 month ago. It is non-tender. He does not know of any trauma to the shoulder. He does play disc golf every other day and notes a clicking in is right shoulder with movement. [place] of mass noted lipoma versus hematoma.  He also complains of his pain in his left neck after lifting heavy objects or playing disc golf when he turns his head to the left. Started one month ago. Pain 2/10. Has taken OTC pain relievers with slight relief.  Review of Systems  Constitutional: Negative for fever, chills, activity change and fatigue.  Neurological:       Denies numbness, tingling in arms.    Denies fever, chills, sweats.     Past Medical History  Diagnosis Date  . Bronchitis   . Gallstones     History   Social History  . Marital Status: Divorced    Spouse Name: N/A    Number of Children: N/A  . Years of Education: N/A   Occupational History  . Not on file.   Social History Main Topics  . Smoking status: Current Some Day Smoker -- 0.50 packs/day    Types: Cigarettes  . Smokeless tobacco: Former Systems developer  . Alcohol Use: Yes     Comment: occ  . Drug Use: No  . Sexual Activity: Not Currently   Other Topics Concern  . Not on file   Social History Narrative   In middle of divorce   Has 2 children   Works at [employer]    Past Surgical History  Procedure Laterality Date  . Appendectomy      Family History  Problem Relation Age of Onset  . Hypertension Mother   . Other Mother     hyperaldosteronism and padgett's disease  . Asthma Mother   . Heart disease Maternal Aunt   . Diabetes Maternal Aunt   . Diabetes Maternal Uncle   . Diabetes Cousin     Allergies  Allergen Reactions  . Dilaudid [Hydromorphone Hcl] Itching and Rash    Current Outpatient Prescriptions on  File Prior to Visit  Medication Sig Dispense Refill  . omeprazole (PRILOSEC OTC) 20 MG tablet Take 1 tablet (20 mg total) by mouth daily.  30 tablet     No current facility-administered medications on file prior to visit.    BP 120/74  Pulse 78  Temp(Src) 97.8 F (36.6 C) (Oral)  Resp 16  Ht 5\' 10"  (1.778 m)  SpO2 98%    Objective:   Physical Exam  Constitutional: He appears well-developed and well-nourished.  HENT:  Head: Normocephalic and atraumatic.  Neck: Neck supple.  Musculoskeletal:  Firm mass noted right shoulder without tenderness on palpation. Good ROM left arm. Good sensation in bilateral arms/hands. Strength 5/5 bilateral upper extremities.  Neurological:  Reflex Scores:      Bicep reflexes are 2+ on the right side and 2+ on the left side.      Brachioradialis reflexes are 2+ on the right side and 2+ on the left side.         Assessment & Plan:  Patient seen by Jake Bathe NP-student.  I have personally seen and examined patient and agree with Ms. Whitmire's assessment and plan.

## 2013-06-07 NOTE — Progress Notes (Signed)
Pre visit review using our clinic review tool, if applicable. No additional management support is needed unless otherwise documented below in the visit note. 

## 2013-09-13 ENCOUNTER — Telehealth: Payer: Self-pay | Admitting: Family

## 2013-09-13 DIAGNOSIS — R2230 Localized swelling, mass and lump, unspecified upper limb: Secondary | ICD-10-CM

## 2013-09-13 NOTE — Telephone Encounter (Signed)
Caller name: Anirudh  Call back number:[phone]   Reason for call:  Pt is calling back in regards to previous office visit (5/29) and would like to speak further about possibly going to a surgeon.  Please call back before 2 if you can.  Thanks.

## 2013-09-13 NOTE — Telephone Encounter (Signed)
Please place referral

## 2013-09-13 NOTE — Telephone Encounter (Signed)
Left detailed message on cell and to call if any questions. 

## 2013-09-13 NOTE — Telephone Encounter (Signed)
Recommend topical otc hydrocortisone cream and benadryl prn itching.  If increased pain, redness, swelling will need to be seen either by [place] or urgent care prior to his 9/9 visit. Referral placed.

## 2013-09-13 NOTE — Addendum Note (Signed)
Addended by: [name] on: 09/13/2013 03:29 PM   Modules accepted: Orders

## 2013-09-13 NOTE — Telephone Encounter (Signed)
Spoke with pt and he wants to proceed with surgical referral. Also states he has a bump behind his knee x 2-3 weeks that itches, stings. Denies leg swelling, not warm to touch and denies shortness of breath. Scheduled appt for 09/18/13 at 10:45.

## 2013-09-18 ENCOUNTER — Ambulatory Visit (INDEPENDENT_AMBULATORY_CARE_PROVIDER_SITE_OTHER): Payer: BC Managed Care – PPO | Admitting: Family

## 2013-09-18 ENCOUNTER — Encounter: Payer: Self-pay | Admitting: Family

## 2013-09-18 VITALS — BP 112/80 | HR 70 | Temp 98.0°F | Resp 16 | Ht 70.0 in | Wt 193.4 lb

## 2013-09-18 DIAGNOSIS — T148 Other injury of unspecified body region: Secondary | ICD-10-CM

## 2013-09-18 DIAGNOSIS — R635 Abnormal weight gain: Secondary | ICD-10-CM | POA: Insufficient documentation

## 2013-09-18 DIAGNOSIS — W57XXXA Bitten or stung by nonvenomous insect and other nonvenomous arthropods, initial encounter: Secondary | ICD-10-CM | POA: Insufficient documentation

## 2013-09-18 LAB — TSH: TSH: 0.55 u[IU]/mL (ref 0.35–4.50)

## 2013-09-18 NOTE — Patient Instructions (Signed)
Eliminate soda, instead drink water. Focus on fresh fruits/veggies. Call if increased itching of bite left leg, redness/swelling. You may use benadryl as needed for itching. Please complete lab work prior to leaving.  Follow up in 6 months.

## 2013-09-18 NOTE — Assessment & Plan Note (Signed)
Area of concern is most consistent with healing insect bite, no sign of infection. Benadryl HS prn itching, call if increased itching of bite left leg, redness/swelling.

## 2013-09-18 NOTE — Progress Notes (Signed)
Pre visit review using our clinic review tool, if applicable. No additional management support is needed unless otherwise documented below in the visit note. 

## 2013-09-18 NOTE — Assessment & Plan Note (Signed)
Obtain TSH. Discussed discontinuation of soda, more fresh fruits/veggies.

## 2013-09-18 NOTE — Progress Notes (Signed)
   Subjective:    Patient ID: Blake Baldwin, male    DOB: 1985-12-04, 28 y.o.   MRN: [id number]  HPI  Blake Baldwin is a 28 yr old male who presents today with chief complaint of "bump" behind the left knee. Reports that he has had for 3 weeks.  Reports that area is pruritic at times but non-painful.   Weight gain- He reports that he has been steadily gaining weight. Does not eat a lot of fresh foods. Reports some fast food, but a fair amount of packaged foods.  Reports that he drinks 1 liter of soda a day. Active in his job (works for [employer]) and also plays disc golf regularly.   Wt Readings from Last 3 Encounters:  09/18/13 193 lb 6.4 oz (87.726 kg)  05/08/13 187 lb (84.823 kg)  02/17/13 172 lb (78.019 kg)     Review of Systems See HPI  Past Medical History  Diagnosis Date  . Bronchitis   . Gallstones     History   Social History  . Marital Status: Divorced    Spouse Name: N/A    Number of Children: N/A  . Years of Education: N/A   Occupational History  . Not on file.   Social History Main Topics  . Smoking status: Current Some Day Smoker -- 0.50 packs/day    Types: Cigarettes  . Smokeless tobacco: Former Systems developer  . Alcohol Use: Yes     Comment: occ  . Drug Use: No  . Sexual Activity: Not Currently   Other Topics Concern  . Not on file   Social History Narrative   In middle of divorce   Has 2 children   Works at [employer]    Past Surgical History  Procedure Laterality Date  . Appendectomy      Family History  Problem Relation Age of Onset  . Hypertension Mother   . Other Mother     hyperaldosteronism and padgett's disease  . Asthma Mother   . Heart disease Maternal Aunt   . Diabetes Maternal Aunt   . Diabetes Maternal Uncle   . Diabetes Cousin     Allergies  Allergen Reactions  . Dilaudid [Hydromorphone Hcl] Itching and Rash    Current Outpatient Prescriptions on File Prior to Visit  Medication Sig Dispense Refill  . omeprazole (PRILOSEC  OTC) 20 MG tablet Take 1 tablet (20 mg total) by mouth daily.  30 tablet     No current facility-administered medications on file prior to visit.    BP 112/80  Pulse 70  Temp(Src) 98 F (36.7 C) (Oral)  Resp 16  Ht 5\' 10"  (1.778 m)  Wt 193 lb 6.4 oz (87.726 kg)  BMI 27.75 kg/m2  SpO2 99%       Objective:   Physical Exam  Constitutional: He appears well-developed and well-nourished. No distress.  Musculoskeletal:  Lipomatous mass again noted right shoulder- size appears unchanged.   Skin:  Pea sized nodular lesion right posterior knee medially, non-tender, non-fluctuant, some hyperpigmentation noted.            Assessment & Plan:

## 2013-11-04 ENCOUNTER — Ambulatory Visit (INDEPENDENT_AMBULATORY_CARE_PROVIDER_SITE_OTHER): Payer: BC Managed Care – PPO | Admitting: Medical

## 2013-11-04 ENCOUNTER — Encounter: Payer: Self-pay | Admitting: Medical

## 2013-11-04 VITALS — BP 136/86 | HR 81 | Temp 98.1°F | Ht 70.0 in | Wt 192.6 lb

## 2013-11-04 DIAGNOSIS — J069 Acute upper respiratory infection, unspecified: Secondary | ICD-10-CM | POA: Insufficient documentation

## 2013-11-04 MED ORDER — BENZONATATE 200 MG PO CAPS: 200.0000 mg | ORAL_CAPSULE | Freq: Three times a day (TID) | ORAL | Status: DC | PRN

## 2013-11-04 MED ORDER — AMOXICILLIN-POT CLAVULANATE 875-125 MG PO TABS: 1.0000 | ORAL_TABLET | Freq: Two times a day (BID) | ORAL | Status: DC

## 2013-11-04 MED ORDER — FLUTICASONE PROPIONATE 50 MCG/ACT NA SUSP: 2.0000 | Freq: Every day | NASAL | Status: DC

## 2013-11-04 NOTE — Progress Notes (Signed)
Pre visit review using our clinic review tool, if applicable. No additional management support is needed unless otherwise documented below in the visit note. 

## 2013-11-04 NOTE — Assessment & Plan Note (Signed)
Rest,hydrate, tylenol for fever. Benzonatate for cough and flonase for congestion. If worsens indicating bronchitis or sinusitis then start augmentin.(Sent to pharmacy).

## 2013-11-04 NOTE — Progress Notes (Signed)
Subjective:    Patient ID: Blake Baldwin, male    DOB: [birth year], 28 y.o.   MRN: [id number]  HPI  Pt in reporting since last wed getting nasal congested. On Thursday past week got cough. Over weekend has had productive cough and some intermittent sinus pressure. Some rt ear pressure. No lt ear pain. No fever and no chills. Pt does smoke 5 cigarettes a day.  At least 3 episodes of bronchitis per year.  Past Medical History  Diagnosis Date  . Bronchitis   . Gallstones     History   Social History  . Marital Status: Divorced    Spouse Name: N/A    Number of Children: N/A  . Years of Education: N/A   Occupational History  . Not on file.   Social History Main Topics  . Smoking status: Current Some Day Smoker -- 0.50 packs/day    Types: Cigarettes  . Smokeless tobacco: Former Systems developer  . Alcohol Use: Yes     Comment: occ  . Drug Use: No  . Sexual Activity: Not Currently   Other Topics Concern  . Not on file   Social History Narrative   In middle of divorce   Has 2 children   Works at [employer]    Past Surgical History  Procedure Laterality Date  . Appendectomy      Family History  Problem Relation Age of Onset  . Hypertension Mother   . Other Mother     hyperaldosteronism and padgett's disease  . Asthma Mother   . Heart disease Maternal Aunt   . Diabetes Maternal Aunt   . Diabetes Maternal Uncle   . Diabetes Cousin     Allergies  Allergen Reactions  . Dilaudid [Hydromorphone Hcl] Itching and Rash    Current Outpatient Prescriptions on File Prior to Visit  Medication Sig Dispense Refill  . omeprazole (PRILOSEC OTC) 20 MG tablet Take 1 tablet (20 mg total) by mouth daily.  30 tablet     No current facility-administered medications on file prior to visit.    BP 136/86  Pulse 81  Temp(Src) 98.1 F (36.7 C) (Oral)  Ht 5\' 10"  (1.778 m)  Wt 192 lb 9.6 oz (87.363 kg)  BMI 27.64 kg/m2  SpO2 97%      Review of Systems  Constitutional: Negative  for fever, chills and fatigue.  HENT: Positive for congestion, postnasal drip, rhinorrhea and sinus pressure. Negative for nosebleeds, sneezing, sore throat, tinnitus and trouble swallowing.   Respiratory: Positive for cough. Negative for choking, shortness of breath and wheezing.        Producutive cough.  Cardiovascular: Negative for chest pain and palpitations.  Gastrointestinal: Negative.   Musculoskeletal: Negative.   Neurological: Negative for dizziness, tremors, seizures, syncope, facial asymmetry, speech difficulty, weakness, light-headedness, numbness and headaches.  Hematological: Negative for adenopathy. Does not bruise/bleed easily.  Psychiatric/Behavioral: Negative.        Objective:   Physical Exam  General  Mental Status - Alert. General Appearance - Well groomed. Not in acute distress.  Skin Rashes- No Rashes.  HEENT Head- Normal. Ear Auditory Canal - Left- Normal. Right - Normal.Tympanic Membrane- Left- Normal. Right- Normal. Eye Sclera/Conjunctiva- Left- Normal. Right- Normal. Nose & Sinuses Nasal Mucosa- Left- Boggy + Congested. Right-  Boggy + Congested. Faint maxillary and frontal sinus pressure. Mouth & Throat Lips: Upper Lip- Normal: no dryness, cracking, pallor, cyanosis, or vesicular eruption. Lower Lip-Normal: no dryness, cracking, pallor, cyanosis or vesicular eruption. Buccal Mucosa-  Bilateral- No Aphthous ulcers. Oropharynx- No Discharge or Erythema. Tonsils: Characteristics- Bilateral- No Erythema or Congestion. Size/Enlargement- Bilateral- No enlargement. Discharge- bilateral-None.  Neck Neck- Supple. No Masses. No lymphadenopathy.   Chest and Lung Exam Auscultation: Breath Sounds:-Normal, clear, even and unlabored  Cardiovascular Auscultation:Rythm- Regular,rate and rythm  Murmurs & Other Heart Sounds:Ausculatation of the heart reveal- No Murmurs.  Lymphatic Head & Neck General Head & Neck Lymphatics: Bilateral: Description- No  Localized lymphadenopathy.        Assessment & Plan:

## 2013-11-04 NOTE — Patient Instructions (Addendum)
Uri vs bronchitis.   Rest hydrate, tylenol for fever. Start benzonatate and flonase. If symptoms worsen indicating sinusitis or bronchitis then start augmentin(made available/sent to your pharmacy) Also now that you are not smoking continue with cessation.  Follow up in 7 days any persisting symptoms  or as needed.

## 2013-11-12 ENCOUNTER — Telehealth: Payer: Self-pay

## 2013-11-12 NOTE — Telephone Encounter (Signed)
Spoke with patient at 945 am concerning coughing up blood. Patient was ask if it was large amounts of blood or blood tinged sputum (phelm). Patient stated that he was experiencing it when he awoke in the mornings and at times during the day. As described by the patient it was not in large amounts or painful. Patient was placed on PCP schedule for follow up due to his concern that something else could be happening. Advised that we would be able to check his lungs again to be sure that he does not have any type of secondary infection. Patient stated that he was good with the follow up plan.

## 2013-11-13 ENCOUNTER — Ambulatory Visit (INDEPENDENT_AMBULATORY_CARE_PROVIDER_SITE_OTHER): Payer: BC Managed Care – PPO | Admitting: Family

## 2013-11-13 ENCOUNTER — Ambulatory Visit (HOSPITAL_BASED_OUTPATIENT_CLINIC_OR_DEPARTMENT_OTHER)
Admission: RE | Admit: 2013-11-13 | Discharge: 2013-11-13 | Disposition: A | Discharge status: Home / Self Care | Payer: BC Managed Care – PPO | Source: Ambulatory Visit | Attending: Family | Admitting: Family

## 2013-11-13 ENCOUNTER — Encounter: Payer: Self-pay | Admitting: Family

## 2013-11-13 VITALS — BP 140/82 | HR 82 | Temp 97.7°F | Resp 16 | Ht 70.0 in | Wt 192.0 lb

## 2013-11-13 DIAGNOSIS — R042 Hemoptysis: Secondary | ICD-10-CM

## 2013-11-13 DIAGNOSIS — R05 Cough: Secondary | ICD-10-CM | POA: Diagnosis present

## 2013-11-13 DIAGNOSIS — Z720 Tobacco use: Secondary | ICD-10-CM

## 2013-11-13 NOTE — Progress Notes (Signed)
Subjective:    Patient ID: Blake Baldwin, male    DOB: [birth year], 28 y.o.   MRN: [id number]  HPI  Mr. Ketchum is a 28 yr old male who presents today to discuss cough. He was seen on 10/26 with chief complaint of cough/congestion. Rx for augmentin was sent to his pharmacy and he has completed this Rx.  Sinus symptoms have improved.  Cough is improving except for when he gets up in the mornings and reports thick mucous which is blood in AM daily since Saturday. Reports temp 99.4 over the weekend.  Pt is not currently    Review of Systems    see HPI  Past Medical History  Diagnosis Date  . Bronchitis   . Gallstones     History   Social History  . Marital Status: Divorced    Spouse Name: N/A    Number of Children: N/A  . Years of Education: N/A   Occupational History  . Not on file.   Social History Main Topics  . Smoking status: Current Some Day Smoker -- 0.50 packs/day    Types: Cigarettes  . Smokeless tobacco: Former Systems developer  . Alcohol Use: Yes     Comment: occ  . Drug Use: No  . Sexual Activity: Not Currently   Other Topics Concern  . Not on file   Social History Narrative   In middle of divorce   Has 2 children   Works at [employer]    Past Surgical History  Procedure Laterality Date  . Appendectomy      Family History  Problem Relation Age of Onset  . Hypertension Mother   . Other Mother     hyperaldosteronism and padgett's disease  . Asthma Mother   . Heart disease Maternal Aunt   . Diabetes Maternal Aunt   . Diabetes Maternal Uncle   . Diabetes Cousin     Allergies  Allergen Reactions  . Dilaudid [Hydromorphone Hcl] Itching and Rash    Current Outpatient Prescriptions on File Prior to Visit  Medication Sig Dispense Refill  . benzonatate (TESSALON) 200 MG capsule Take 1 capsule (200 mg total) by mouth 3 (three) times daily as needed for cough. 30 capsule 0  . fluticasone (FLONASE) 50 MCG/ACT nasal spray Place 2 sprays into both nostrils  daily. 16 g 1  . omeprazole (PRILOSEC OTC) 20 MG tablet Take 1 tablet (20 mg total) by mouth daily. 30 tablet    No current facility-administered medications on file prior to visit.    BP 140/82 mmHg  Pulse 82  Temp(Src) 97.7 F (36.5 C) (Oral)  Resp 16  Ht 5\' 10"  (1.778 m)  Wt 192 lb (87.091 kg)  BMI 27.55 kg/m2  SpO2 96%    Objective:   Physical Exam  Constitutional: He is oriented to person, place, and time. He appears well-developed and well-nourished. No distress.  HENT:  Head: Normocephalic and atraumatic.  Right Ear: Tympanic membrane and ear canal normal.  Left Ear: Tympanic membrane and ear canal normal.  Mouth/Throat: No oropharyngeal exudate, posterior oropharyngeal edema or posterior oropharyngeal erythema.  Cardiovascular: Normal rate and regular rhythm.   No murmur heard. Pulmonary/Chest: Effort normal and breath sounds normal. No respiratory distress. He has no wheezes. He has no rales. He exhibits no tenderness.  Musculoskeletal: He exhibits no edema.  Neurological: He is alert and oriented to person, place, and time.  Psychiatric: His behavior is normal. Judgment and thought content normal.  Flat affect  Assessment & Plan:

## 2013-11-13 NOTE — Progress Notes (Signed)
Pre visit review using our clinic review tool, if applicable. No additional management support is needed unless otherwise documented below in the visit note. 

## 2013-11-13 NOTE — Assessment & Plan Note (Signed)
?   Due to excessive coughing/recent bronchitis.  Will obtain CXR for further eval and to rule out PNA. If infiltrate, plan further abx. Refer to pulmonology since pt is a smoker.  Pt has not smoked during this acute illness.  Pt is counseled on smoking cessation x 3-5 minutes.

## 2013-11-13 NOTE — Patient Instructions (Signed)
Please complete your chest x ray on the first floor.  We will contact you with results and with your appointment to see the lung doctor. Call if symptoms worsen, or if not improved in 1 week.

## 2013-11-20 ENCOUNTER — Institutional Professional Consult (permissible substitution): Payer: BC Managed Care – PPO | Admitting: Internal Medicine

## 2014-02-10 ENCOUNTER — Ambulatory Visit (INDEPENDENT_AMBULATORY_CARE_PROVIDER_SITE_OTHER): Payer: BLUE CROSS/BLUE SHIELD | Admitting: Medical

## 2014-02-10 ENCOUNTER — Encounter: Payer: Self-pay | Admitting: Medical

## 2014-02-10 VITALS — BP 129/83 | HR 99 | Temp 98.3°F | Ht 70.0 in | Wt 194.4 lb

## 2014-02-10 DIAGNOSIS — K2100 Gastro-esophageal reflux disease with esophagitis, without bleeding: Secondary | ICD-10-CM

## 2014-02-10 NOTE — Progress Notes (Signed)
Subjective:    Patient ID: Blake Baldwin, male    DOB: Oct 05, 1985, 29 y.o.   MRN: [id number]  HPI   Pt states yesterday morning he woke up with cough. Pt states hx of reflux and sometimes he states that he will cough when this flares. Pt states/ denies upper respiratory infection signs and symptoms.   Pt does not feel chest congested. Some possible blood tinged mucous at times. Pt has history of smoking(He did so for 10 years but then stopped) . The chest xray in November 2015. Xray was negative.  Pt does not notice if eating. He does drink caffeinated beverage some daily. He  Eats a lot of processed food.  No hx of asthma. No use of ace inhibitors.   Pt states he usually coughs more in the mornings.  He states sometimes he wakes with acid reflux that will cause severe cough for 30 minutes next day his throat will hurt for a whole day.  On omeprazole for about 1 yr.     Review of Systems  Constitutional: Negative for fever and diaphoresis.  Respiratory: Positive for cough. Negative for shortness of breath and wheezing.        By his description he describes likely gerd related.  Cardiovascular: Negative for chest pain and palpitations.  Gastrointestinal: Negative for nausea, abdominal pain, diarrhea, constipation, blood in stool, abdominal distention and anal bleeding.       Reflux with dry cough. Usually worse lying down on his back.  Musculoskeletal: Negative for back pain.  Hematological: Negative for adenopathy. Does not bruise/bleed easily.  Psychiatric/Behavioral: Negative for behavioral problems and confusion.   Past Medical History  Diagnosis Date  . Bronchitis   . Gallstones     History   Social History  . Marital Status: Divorced    Spouse Name: N/A    Number of Children: N/A  . Years of Education: N/A   Occupational History  . Not on file.   Social History Main Topics  . Smoking status: Former Smoker -- 0.50 packs/day    Types: Cigarettes  .  Smokeless tobacco: Former Systems developer  . Alcohol Use: 0.0 oz/week    0 Not specified per week     Comment: occ  . Drug Use: No  . Sexual Activity: Not Currently   Other Topics Concern  . Not on file   Social History Narrative   In middle of divorce   Has 2 children   Works at [employer]    Past Surgical History  Procedure Laterality Date  . Appendectomy      Family History  Problem Relation Age of Onset  . Hypertension Mother   . Other Mother     hyperaldosteronism and padgett's disease  . Asthma Mother   . Heart disease Maternal Aunt   . Diabetes Maternal Aunt   . Diabetes Maternal Uncle   . Diabetes Cousin     Allergies  Allergen Reactions  . Dilaudid [Hydromorphone Hcl] Itching and Rash    Current Outpatient Prescriptions on File Prior to Visit  Medication Sig Dispense Refill  . omeprazole (PRILOSEC OTC) 20 MG tablet Take 1 tablet (20 mg total) by mouth daily. 30 tablet   . benzonatate (TESSALON) 200 MG capsule Take 1 capsule (200 mg total) by mouth 3 (three) times daily as needed for cough. (Patient not taking: Reported on 02/10/2014) 30 capsule 0  . fluticasone (FLONASE) 50 MCG/ACT nasal spray Place 2 sprays into both nostrils daily. (Patient not  taking: Reported on 02/10/2014) 16 g 1   No current facility-administered medications on file prior to visit.    BP 129/83 mmHg  Pulse 99  Temp(Src) 98.3 F (36.8 C) (Oral)  Ht 5\' 10"  (1.778 m)  Wt 194 lb 6.4 oz (88.179 kg)  BMI 27.89 kg/m2  SpO2 96%       Objective:   Physical Exam    General Appearance- Not in acute distress.     General  Mental Status - Alert. General Appearance - Well groomed. Not in acute distress.  Skin Rashes- No Rashes.  HEENT Head- Normal. Ear Auditory Canal - Left- Normal. Right - Normal.Tympanic Membrane- Left- Normal. Right- Normal. Eye Sclera/Conjunctiva- Left- Normal. Right- Normal. Nose & Sinuses Nasal Mucosa- Left-  Not boggy or Congested. Right-  Not  boggy or  Congested. Mouth & Throat Lips: Upper Lip- Normal: no dryness, cracking, pallor, cyanosis, or vesicular eruption. Lower Lip-Normal: no dryness, cracking, pallor, cyanosis or vesicular eruption. Buccal Mucosa- Bilateral- No Aphthous ulcers. Oropharynx- No Discharge or Erythema. Tonsils: Characteristics- Bilateral- No Erythema or Congestion. Size/Enlargement- Bilateral- No enlargement. Discharge- bilateral-None.  Neck Neck- Supple. No Masses.     Lymphatic Head & Neck General Head & Neck Lymphatics: Bilateral: Description- No Localized lymphadenopathy.   Chest and Lung Exam Auscultation: Breath sounds:-Normal. Adventitious sounds:- No Adventitious sounds.  Cardiovascular Auscultation:Rythm - Regular. Heart Sounds -Normal heart sounds.  Abdomen Inspection:-Inspection Normal.  Palpation/Perucssion: Palpation and Percussion of the abdomen reveal- Non Tender, No Rebound tenderness, No rigidity(Guarding) and No Palpable abdominal masses.  Liver:-Normal.  Spleen:- Normal.           Assessment & Plan:

## 2014-02-10 NOTE — Assessment & Plan Note (Signed)
Your cough seems to be gerd related. I want you to continue omeprazole otc and also get zantac otc(but use the 150 mg dose twice daily).  I want you to follow up in 2 weeks or as needed.  I think if you symptoms don't improve then would refer you to to GI for likely egd.

## 2014-02-10 NOTE — Progress Notes (Signed)
Pre visit review using our clinic review tool, if applicable. No additional management support is needed unless otherwise documented below in the visit note. 

## 2014-02-10 NOTE — Patient Instructions (Signed)
Your cough seems to be gerd related. I want you to continue omeprazole otc and also get zantac otc(but use the 150 mg dose twice daily).  I want you to follow up in 2 weeks or as needed.  I think if you symptoms don't improve then would refer you to to GI for likely egd.  Food Choices for Gastroesophageal Reflux Disease When you have gastroesophageal reflux disease (GERD), the foods you eat and your eating habits are very important. Choosing the right foods can help ease the discomfort of GERD. WHAT GENERAL GUIDELINES DO I NEED TO FOLLOW?  Choose fruits, vegetables, whole grains, low-fat dairy products, and low-fat meat, fish, and poultry.  Limit fats such as oils, salad dressings, butter, nuts, and avocado.  Keep a food diary to identify foods that cause symptoms.  Avoid foods that cause reflux. These may be different for different people.  Eat frequent small meals instead of three large meals each day.  Eat your meals slowly, in a relaxed setting.  Limit fried foods.  Cook foods using methods other than frying.  Avoid drinking alcohol.  Avoid drinking large amounts of liquids with your meals.  Avoid bending over or lying down until 2-3 hours after eating. WHAT FOODS ARE NOT RECOMMENDED? The following are some foods and drinks that may worsen your symptoms: Vegetables Tomatoes. Tomato juice. Tomato and spaghetti sauce. Chili peppers. Onion and garlic. Horseradish. Fruits Oranges, grapefruit, and lemon (fruit and juice). Meats High-fat meats, fish, and poultry. This includes hot dogs, ribs, ham, sausage, salami, and bacon. Dairy Whole milk and chocolate milk. Sour cream. Cream. Butter. Ice cream. Cream cheese.  Beverages Coffee and tea, with or without caffeine. Carbonated beverages or energy drinks. Condiments Hot sauce. Barbecue sauce.  Sweets/Desserts Chocolate and cocoa. Donuts. Peppermint and spearmint. Fats and Oils High-fat foods, including [place] fries and  potato chips. Other Vinegar. Strong spices, such as black pepper, white pepper, red pepper, cayenne, curry powder, cloves, ginger, and chili powder. The items listed above may not be a complete list of foods and beverages to avoid. Contact your dietitian for more information. Document Released: 12/27/2004 Document Revised: 01/01/2013 Document Reviewed: 10/31/2012 [facility] Patient Information 2015 [place], [place]. This information is not intended to replace advice given to you by your health care provider. Make sure you discuss any questions you have with your health care provider.

## 2014-02-24 ENCOUNTER — Encounter: Payer: BLUE CROSS/BLUE SHIELD | Admitting: Medical

## 2014-02-24 NOTE — Progress Notes (Signed)
This encounter was created in error - please disregard.

## 2014-02-26 ENCOUNTER — Ambulatory Visit (INDEPENDENT_AMBULATORY_CARE_PROVIDER_SITE_OTHER): Payer: BLUE CROSS/BLUE SHIELD | Admitting: Medical

## 2014-02-26 ENCOUNTER — Encounter: Payer: Self-pay | Admitting: Medical

## 2014-02-26 ENCOUNTER — Telehealth: Payer: Self-pay | Admitting: *Deleted

## 2014-02-26 VITALS — BP 139/83 | HR 84 | Temp 98.2°F | Ht 70.0 in | Wt 194.8 lb

## 2014-02-26 DIAGNOSIS — B079 Viral wart, unspecified: Secondary | ICD-10-CM | POA: Insufficient documentation

## 2014-02-26 DIAGNOSIS — L739 Follicular disorder, unspecified: Secondary | ICD-10-CM | POA: Insufficient documentation

## 2014-02-26 MED ORDER — MUPIROCIN 2 % EX OINT: 1.0000 "application " | TOPICAL_OINTMENT | Freq: Three times a day (TID) | CUTANEOUS | Status: DC

## 2014-02-26 MED ORDER — DOXYCYCLINE HYCLATE 100 MG PO TABS: 100.0000 mg | ORAL_TABLET | Freq: Two times a day (BID) | ORAL | Status: DC

## 2014-02-26 NOTE — Progress Notes (Signed)
Pre visit review using our clinic review tool, if applicable. No additional management support is needed unless otherwise documented below in the visit note. 

## 2014-02-26 NOTE — Telephone Encounter (Signed)
Patient calling to clarify Bactroban order.  Current order is for nasal Bactroban, but affected area is on scalp.  Clarified with [employer] and Rx changed to topical, apply to affected area.

## 2014-02-26 NOTE — Assessment & Plan Note (Signed)
Refer to dermatologist 

## 2014-02-26 NOTE — Assessment & Plan Note (Addendum)
Mupirocin rx to apply topically. Pt declined oral. If topical not resolving then start doxycycline. Girlfriend will check if pinkness/redness resolving.

## 2014-02-26 NOTE — Patient Instructions (Signed)
Folliculitis Mupirocin rx to apply topically. Pt declined oral. If topical not resolving then start doxycycline. Girlfriend will check if pinkness/redness resolving.   Wart Refer to dermatologist.     Follow up 7-10 days or as needed(Persisting or worsening symptoms.)

## 2014-02-26 NOTE — Progress Notes (Signed)
   Subjective:    Patient ID: Blake Baldwin, male    DOB: 02-07-1985, 29 y.o.   MRN: [id number]  HPI    Pt in for lt hand lesion. This has been present for a year. At first was small. He thought callous. But this area is not healing. He states if area gets wet hurts little. And is irritated. Pt states skin feels loose in center. He picked at area and skin will come out like a cork. Area will feel better and the reoccur.  Pt also has  Small tender area on his scalp for about a week. It hurts to palpation. Only one area.     Review of Systems  Constitutional: Negative for fever, chills and fatigue.  Respiratory: Negative for cough, shortness of breath and wheezing.   Cardiovascular: Negative for chest pain.  Gastrointestinal: Negative for abdominal pain.       Prior reflux and cough resolved. Diet and otc antacid resolved.  Genitourinary: Negative for frequency and flank pain.  Musculoskeletal: Negative for back pain.  Skin:       Lt hand- skin lesion. Scalp- tender area.   Past Medical History  Diagnosis Date  . Bronchitis   . Gallstones     History   Social History  . Marital Status: Divorced    Spouse Name: N/A  . Number of Children: N/A  . Years of Education: N/A   Occupational History  . Not on file.   Social History Main Topics  . Smoking status: Former Smoker -- 0.50 packs/day    Types: Cigarettes  . Smokeless tobacco: Former Systems developer  . Alcohol Use: 0.0 oz/week    0 Standard drinks or equivalent per week     Comment: occ  . Drug Use: No  . Sexual Activity: Not Currently   Other Topics Concern  . Not on file   Social History Narrative   In middle of divorce   Has 2 children   Works at [employer]    Past Surgical History  Procedure Laterality Date  . Appendectomy      Family History  Problem Relation Age of Onset  . Hypertension Mother   . Other Mother     hyperaldosteronism and padgett's disease  . Asthma Mother   . Heart disease Maternal Aunt    . Diabetes Maternal Aunt   . Diabetes Maternal Uncle   . Diabetes Cousin     Allergies  Allergen Reactions  . Dilaudid [Hydromorphone Hcl] Itching and Rash    Current Outpatient Prescriptions on File Prior to Visit  Medication Sig Dispense Refill  . omeprazole (PRILOSEC OTC) 20 MG tablet Take 1 tablet (20 mg total) by mouth daily. 30 tablet    No current facility-administered medications on file prior to visit.    BP 139/83 mmHg  Pulse 84  Temp(Src) 98.2 F (36.8 C) (Oral)  Ht 5\' 10"  (1.778 m)  Wt 194 lb 12.8 oz (88.361 kg)  BMI 27.95 kg/m2  SpO2 97%       Objective:   Physical Exam    General- No acute distress. Pleasant patient. Neck- Full range of motion, no jvd Lungs- Clear, even and unlabored. Heart- regular rate and rhythm. Neurologic- CNII- XII grossly intact. Lt hand- Below 4th digit. At base of palm. 76mm area flat and mild raised. With warty appearance. Scalp- small folliculitis on type of scalp. Faint tender inflamed area.        Assessment & Plan:

## 2014-03-21 ENCOUNTER — Ambulatory Visit: Payer: BLUE CROSS/BLUE SHIELD | Admitting: Family

## 2014-03-25 ENCOUNTER — Encounter: Payer: Self-pay | Admitting: Family

## 2014-03-25 ENCOUNTER — Telehealth: Payer: Self-pay | Admitting: Family

## 2014-03-25 NOTE — Telephone Encounter (Signed)
Yes, please send no show.

## 2014-03-25 NOTE — Telephone Encounter (Signed)
Pt was no show for appt on 03/21/14- Letter sent, Charge no show?

## 2014-03-25 NOTE — Telephone Encounter (Signed)
Charge request sent

## 2014-05-30 ENCOUNTER — Ambulatory Visit (INDEPENDENT_AMBULATORY_CARE_PROVIDER_SITE_OTHER): Payer: BLUE CROSS/BLUE SHIELD | Admitting: Medical

## 2014-05-30 ENCOUNTER — Ambulatory Visit (HOSPITAL_BASED_OUTPATIENT_CLINIC_OR_DEPARTMENT_OTHER)
Admission: RE | Admit: 2014-05-30 | Discharge: 2014-05-30 | Disposition: A | Discharge status: Home / Self Care | Payer: BLUE CROSS/BLUE SHIELD | Source: Ambulatory Visit | Attending: Medical | Admitting: Medical

## 2014-05-30 ENCOUNTER — Encounter: Payer: Self-pay | Admitting: Medical

## 2014-05-30 VITALS — BP 132/85 | HR 91 | Temp 98.3°F | Ht 70.0 in | Wt 194.2 lb

## 2014-05-30 DIAGNOSIS — M25531 Pain in right wrist: Secondary | ICD-10-CM | POA: Insufficient documentation

## 2014-05-30 MED ORDER — DICLOFENAC SODIUM 75 MG PO TBEC: 75.0000 mg | DELAYED_RELEASE_TABLET | Freq: Two times a day (BID) | ORAL | Status: DC

## 2014-05-30 NOTE — Assessment & Plan Note (Signed)
Xray of wrist. Get wrist cock up split. Use over weekend. Rx diclofenac. Follow in 5 days. If pain persists then refer to sports medicine.

## 2014-05-30 NOTE — Progress Notes (Signed)
   Subjective:    Patient ID: Blake Baldwin, male    DOB: [birth year], 29 y.o.   MRN: [id number]  HPI  Pt in with some rt wrist pain. Pt states pain wed morning. No trauma or fall. No unsual activity. No work injury. He is rt handed. He does play disc golf. When he pronate and supinate forearm/wrist has distal unla(tip of) pain. Pt tried advil and did not help much. He speculates he slept on area wrong. When he twist wrist will fell pop at distal ulna tip. Pain for 2 days.    Review of Systems  Musculoskeletal:       Rt wrist(ulna tip)  Neurological: Negative for weakness and numbness.      Past Medical History  Diagnosis Date  . Bronchitis   . Gallstones     History   Social History  . Marital Status: Divorced    Spouse Name: N/A  . Number of Children: N/A  . Years of Education: N/A   Occupational History  . Not on file.   Social History Main Topics  . Smoking status: Former Smoker -- 0.50 packs/day    Types: Cigarettes  . Smokeless tobacco: Former Systems developer  . Alcohol Use: 0.0 oz/week    0 Standard drinks or equivalent per week     Comment: occ  . Drug Use: No  . Sexual Activity: Not Currently   Other Topics Concern  . Not on file   Social History Narrative   In middle of divorce   Has 2 children   Works at [employer]    Past Surgical History  Procedure Laterality Date  . Appendectomy      Family History  Problem Relation Age of Onset  . Hypertension Mother   . Other Mother     hyperaldosteronism and padgett's disease  . Asthma Mother   . Heart disease Maternal Aunt   . Diabetes Maternal Aunt   . Diabetes Maternal Uncle   . Diabetes Cousin     Allergies  Allergen Reactions  . Dilaudid [Hydromorphone Hcl] Itching and Rash    Current Outpatient Prescriptions on File Prior to Visit  Medication Sig Dispense Refill  . omeprazole (PRILOSEC OTC) 20 MG tablet Take 1 tablet (20 mg total) by mouth daily. 30 tablet    No current facility-administered  medications on file prior to visit.    BP 132/85 mmHg  Pulse 91  Temp(Src) 98.3 F (36.8 C) (Oral)  Ht 5\' 10"  (1.778 m)  Wt 194 lb 3.2 oz (88.089 kg)  BMI 27.86 kg/m2  SpO2 97%       Objective:   Physical Exam  General - no acute distress.  Rt wrist- on pronation and supination rt foream distl ulna tip pain and mild wrist pain.No redness or swelling.      Assessment & Plan:

## 2014-05-30 NOTE — Progress Notes (Signed)
Pre visit review using our clinic review tool, if applicable. No additional management support is needed unless otherwise documented below in the visit note. 

## 2014-05-30 NOTE — Patient Instructions (Addendum)
Wrist pain Xray of wrist. Get wrist cock up split. Use over weekend. Rx diclofenac. Follow in 5 days. If pain persists then refer to sports medicine.

## 2014-06-18 ENCOUNTER — Ambulatory Visit (INDEPENDENT_AMBULATORY_CARE_PROVIDER_SITE_OTHER): Payer: BLUE CROSS/BLUE SHIELD | Admitting: Family

## 2014-06-18 ENCOUNTER — Encounter: Payer: Self-pay | Admitting: Family

## 2014-06-18 VITALS — BP 136/78 | HR 71 | Temp 97.8°F | Resp 16 | Ht 70.0 in | Wt 194.0 lb

## 2014-06-18 DIAGNOSIS — S46911A Strain of unspecified muscle, fascia and tendon at shoulder and upper arm level, right arm, initial encounter: Secondary | ICD-10-CM | POA: Diagnosis not present

## 2014-06-18 NOTE — Assessment & Plan Note (Signed)
Patient with right shoulder pain.  Seems to be improving overall. Continue NSAIDS. If not improved in 1 week plan referral to sports medicine.

## 2014-06-18 NOTE — Progress Notes (Signed)
Pre visit review using our clinic review tool, if applicable. No additional management support is needed unless otherwise documented below in the visit note. 

## 2014-06-18 NOTE — Patient Instructions (Signed)
Continue voltaren as needed.  If pain is severe you may also use tylenol as needed. Call if pain is not improved in 1 week.

## 2014-06-18 NOTE — Progress Notes (Signed)
   Subjective:    Patient ID: Blake Baldwin, male    DOB: 1985/02/22, 29 y.o.   MRN: [id number]  HPI  Mr. Busk is a 29 yr old male who presents today with chief complaint of right shoulder pain. Pain started 8 days ago and is associated with decreased range of motion. He works at the [employer], does some lifting.  He has tried some voltaren and advil with some improvement.  Notes less pain today with ROM.   Review of Systems See HPI  Past Medical History  Diagnosis Date  . Bronchitis   . Gallstones     History   Social History  . Marital Status: Divorced    Spouse Name: N/A  . Number of Children: N/A  . Years of Education: N/A   Occupational History  . Not on file.   Social History Main Topics  . Smoking status: Former Smoker -- 0.50 packs/day    Types: Cigarettes  . Smokeless tobacco: Former Systems developer  . Alcohol Use: 0.0 oz/week    0 Standard drinks or equivalent per week     Comment: occ  . Drug Use: No  . Sexual Activity: Not Currently   Other Topics Concern  . Not on file   Social History Narrative   In middle of divorce   Has 2 children   Works at [employer]    Past Surgical History  Procedure Laterality Date  . Appendectomy      Family History  Problem Relation Age of Onset  . Hypertension Mother   . Other Mother     hyperaldosteronism and padgett's disease  . Asthma Mother   . Heart disease Maternal Aunt   . Diabetes Maternal Aunt   . Diabetes Maternal Uncle   . Diabetes Cousin     Allergies  Allergen Reactions  . Dilaudid [Hydromorphone Hcl] Itching and Rash    Current Outpatient Prescriptions on File Prior to Visit  Medication Sig Dispense Refill  . diclofenac (VOLTAREN) 75 MG EC tablet Take 1 tablet (75 mg total) by mouth 2 (two) times daily. 30 tablet 0  . omeprazole (PRILOSEC OTC) 20 MG tablet Take 1 tablet (20 mg total) by mouth daily. 30 tablet    No current facility-administered medications on file prior to visit.    BP  136/78 mmHg  Pulse 71  Temp(Src) 97.8 F (36.6 C) (Oral)  Resp 16  Ht 5\' 10"  (1.778 m)  Wt 194 lb (87.998 kg)  BMI 27.84 kg/m2  SpO2 98%       Objective:   Physical Exam  Constitutional: He is oriented to person, place, and time. He appears well-developed and well-nourished. No distress.  Cardiovascular: Normal rate and regular rhythm.   No murmur heard. Pulmonary/Chest: Effort normal and breath sounds normal. No respiratory distress. He has no wheezes. He has no rales. He exhibits no tenderness.  Musculoskeletal:  R shoulder non-tender, no swelling noted.  Neg empty can, full ROM  Neurological: He is alert and oriented to person, place, and time.  Psychiatric: He has a normal mood and affect. His behavior is normal. Judgment and thought content normal.          Assessment & Plan:

## 2014-08-06 ENCOUNTER — Encounter: Payer: Self-pay | Admitting: Medical

## 2014-08-06 ENCOUNTER — Ambulatory Visit (INDEPENDENT_AMBULATORY_CARE_PROVIDER_SITE_OTHER): Payer: BLUE CROSS/BLUE SHIELD | Admitting: Medical

## 2014-08-06 VITALS — BP 136/86 | HR 85 | Temp 98.2°F | Ht 70.0 in | Wt 200.2 lb

## 2014-08-06 DIAGNOSIS — B49 Unspecified mycosis: Secondary | ICD-10-CM | POA: Diagnosis not present

## 2014-08-06 MED ORDER — CLOTRIMAZOLE-BETAMETHASONE 1-0.05 % EX CREA: 1.0000 "application " | TOPICAL_CREAM | Freq: Two times a day (BID) | CUTANEOUS | Status: DC

## 2014-08-06 NOTE — Progress Notes (Signed)
   Subjective:    Patient ID: Blake Baldwin, male    DOB: 07-06-1985, 29 y.o.   MRN: [id number]  HPI   Pt in with some groin area chaffing. Pt states he is outdoors a lot. He states with heat and also playing disc golf his groin is sore. Skin looks pinkish red  and it itches. This has been going on for about 3 wks. He tried gold bond helps briefly.   Review of Systems  Constitutional: Negative for chills and fatigue.  Respiratory: Negative for cough, shortness of breath and wheezing.   Cardiovascular: Negative for chest pain and palpitations.  Skin:       Groin rash.  Hematological: Negative for adenopathy. Does not bruise/bleed easily.  Psychiatric/Behavioral: Negative for behavioral problems and confusion.   Past Medical History  Diagnosis Date  . Bronchitis   . Gallstones     History   Social History  . Marital Status: Divorced    Spouse Name: N/A  . Number of Children: N/A  . Years of Education: N/A   Occupational History  . Not on file.   Social History Main Topics  . Smoking status: Former Smoker -- 0.50 packs/day    Types: Cigarettes  . Smokeless tobacco: Former Systems developer  . Alcohol Use: 0.0 oz/week    0 Standard drinks or equivalent per week     Comment: occ  . Drug Use: No  . Sexual Activity: Not Currently   Other Topics Concern  . Not on file   Social History Narrative   In middle of divorce   Has 2 children   Works at [employer]    Past Surgical History  Procedure Laterality Date  . Appendectomy      Family History  Problem Relation Age of Onset  . Hypertension Mother   . Other Mother     hyperaldosteronism and padgett's disease  . Asthma Mother   . Heart disease Maternal Aunt   . Diabetes Maternal Aunt   . Diabetes Maternal Uncle   . Diabetes Cousin     Allergies  Allergen Reactions  . Dilaudid [Hydromorphone Hcl] Itching and Rash    Current Outpatient Prescriptions on File Prior to Visit  Medication Sig Dispense Refill  .  omeprazole (PRILOSEC OTC) 20 MG tablet Take 1 tablet (20 mg total) by mouth daily. 30 tablet   . diclofenac (VOLTAREN) 75 MG EC tablet Take 1 tablet (75 mg total) by mouth 2 (two) times daily. (Patient not taking: Reported on 08/06/2014) 30 tablet 0   No current facility-administered medications on file prior to visit.    BP 136/86 mmHg  Pulse 85  Temp(Src) 98.2 F (36.8 C) (Oral)  Ht 5\' 10"  (1.778 m)  Wt 200 lb 3.2 oz (90.81 kg)  BMI 28.73 kg/m2  SpO2 95%       Objective:   Physical Exam  General- No acute distress. Pleasant patient. Groin- both sides of groin. Mild pinkish red. Rt side. Moderate appearance of probable skin tag.       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 Rx lotrisone.   Keep area dry as possible. Take break from disc golf.  Will refer pt to derm for removal of rt groin skin tag like lesion.  Follow up 2 wks or as needed

## 2014-08-06 NOTE — Progress Notes (Signed)
Pre visit review using our clinic review tool, if applicable. No additional management support is needed unless otherwise documented below in the visit note. 

## 2014-08-06 NOTE — Patient Instructions (Signed)
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 Rx lotrisone.   Keep area dry as possible. Take break from disc golf.  Will refer pt to derm for removal of rt groin skin tag like lesion.  Follow up 2 wks or as needed

## 2014-08-07 ENCOUNTER — Encounter: Payer: Self-pay | Admitting: Family

## 2014-11-07 ENCOUNTER — Encounter: Payer: Self-pay | Admitting: Family

## 2014-11-07 ENCOUNTER — Ambulatory Visit (INDEPENDENT_AMBULATORY_CARE_PROVIDER_SITE_OTHER): Payer: BLUE CROSS/BLUE SHIELD | Admitting: Family

## 2014-11-07 VITALS — BP 129/80 | HR 72 | Temp 97.9°F | Resp 16 | Ht 70.0 in | Wt 196.4 lb

## 2014-11-07 DIAGNOSIS — S39012A Strain of muscle, fascia and tendon of lower back, initial encounter: Secondary | ICD-10-CM

## 2014-11-07 DIAGNOSIS — D696 Thrombocytopenia, unspecified: Secondary | ICD-10-CM

## 2014-11-07 DIAGNOSIS — R110 Nausea: Secondary | ICD-10-CM | POA: Diagnosis not present

## 2014-11-07 LAB — HEPATIC FUNCTION PANEL
ALBUMIN: 4.6 g/dL (ref 3.5–5.2)
ALT: 16 U/L (ref 0–53)
AST: 13 U/L (ref 0–37)
Alkaline Phosphatase: 61 U/L (ref 39–117)
Bilirubin, Direct: 0.1 mg/dL (ref 0.0–0.3)
TOTAL PROTEIN: 7.5 g/dL (ref 6.0–8.3)
Total Bilirubin: 0.8 mg/dL (ref 0.2–1.2)

## 2014-11-07 LAB — CBC WITH DIFFERENTIAL/PLATELET
BASOS PCT: 0.6 % (ref 0.0–3.0)
Basophils Absolute: 0 10*3/uL (ref 0.0–0.1)
EOS PCT: 9.6 % — AB (ref 0.0–5.0)
Eosinophils Absolute: 0.5 10*3/uL (ref 0.0–0.7)
HEMATOCRIT: 46 % (ref 39.0–52.0)
HEMOGLOBIN: 15.8 g/dL (ref 13.0–17.0)
Lymphocytes Relative: 32.5 % (ref 12.0–46.0)
Lymphs Abs: 1.6 10*3/uL (ref 0.7–4.0)
MCHC: 34.3 g/dL (ref 30.0–36.0)
MCV: 92.5 fl (ref 78.0–100.0)
Monocytes Absolute: 0.4 10*3/uL (ref 0.1–1.0)
Monocytes Relative: 7.9 % (ref 3.0–12.0)
Neutro Abs: 2.4 10*3/uL (ref 1.4–7.7)
Neutrophils Relative %: 49.4 % (ref 43.0–77.0)
PLATELETS: 157 10*3/uL (ref 150.0–400.0)
RBC: 4.98 Mil/uL (ref 4.22–5.81)
RDW: 12.9 % (ref 11.5–15.5)
WBC: 4.9 10*3/uL (ref 4.0–10.5)

## 2014-11-07 MED ORDER — MELOXICAM 7.5 MG PO TABS: 7.5000 mg | ORAL_TABLET | Freq: Every day | ORAL | Status: DC

## 2014-11-07 MED ORDER — CYCLOBENZAPRINE HCL 5 MG PO TABS: 5.0000 mg | ORAL_TABLET | Freq: Three times a day (TID) | ORAL | Status: DC | PRN

## 2014-11-07 NOTE — Progress Notes (Signed)
Pre visit review using our clinic review tool, if applicable. No additional management support is needed unless otherwise documented below in the visit note. 

## 2014-11-07 NOTE — Progress Notes (Signed)
Subjective:    Patient ID: Blake Baldwin, male    DOB: 09-Jun-1985, 29 y.o.   MRN: [id number]  HPI  Blake Baldwin is a 29 yr old male who presents today with chief complaint of right low back pain.  Pain initially began 2 months ago but worsened yesterday.  Reports that pain worse with ongoing activity.  Pain is non-radiating.  Reports that he took excedrin yesterday without improvement in his pain.  At rest pain is 4-5/10.  Pain is made worse by certain movements such as twisting.   Reports intermittent nausea x 4-5 days.  Has had a lot of stress.  Struggling financially.  Works at [employer].   Review of Systems See HPI  Past Medical History  Diagnosis Date  . Bronchitis   . Gallstones     Social History   Social History  . Marital Status: Divorced    Spouse Name: N/A  . Number of Children: N/A  . Years of Education: N/A   Occupational History  . Not on file.   Social History Main Topics  . Smoking status: Former Smoker -- 0.50 packs/day    Types: Cigarettes  . Smokeless tobacco: Former Systems developer  . Alcohol Use: 0.0 oz/week    0 Standard drinks or equivalent per week     Comment: occ  . Drug Use: No  . Sexual Activity: Not Currently   Other Topics Concern  . Not on file   Social History Narrative   In middle of divorce   Has 2 children   Works at [employer]    Past Surgical History  Procedure Laterality Date  . Appendectomy      Family History  Problem Relation Age of Onset  . Hypertension Mother   . Other Mother     hyperaldosteronism and padgett's disease  . Asthma Mother   . Heart disease Maternal Aunt   . Diabetes Maternal Aunt   . Diabetes Maternal Uncle   . Diabetes Cousin     Allergies  Allergen Reactions  . Dilaudid [Hydromorphone Hcl] Itching and Rash    Current Outpatient Prescriptions on File Prior to Visit  Medication Sig Dispense Refill  . omeprazole (PRILOSEC OTC) 20 MG tablet Take 1 tablet (20 mg total) by mouth daily. 30 tablet    No  current facility-administered medications on file prior to visit.    BP 129/80 mmHg  Pulse 72  Temp(Src) 97.9 F (36.6 C) (Oral)  Resp 16  Ht 5\' 10"  (1.778 m)  Wt 196 lb 6.4 oz (89.086 kg)  BMI 28.18 kg/m2  SpO2 99%       Objective:   Physical Exam  Constitutional: He is oriented to person, place, and time. He appears well-developed and well-nourished. No distress.  HENT:  Head: Normocephalic and atraumatic.  Cardiovascular: Normal rate and regular rhythm.   No murmur heard. Pulmonary/Chest: Effort normal and breath sounds normal. No respiratory distress. He has no wheezes. He has no rales.  Abdominal: Soft. Bowel sounds are normal. He exhibits no distension. There is no tenderness.  Musculoskeletal: He exhibits no edema.       Cervical back: He exhibits no tenderness.       Thoracic back: He exhibits no tenderness.       Lumbar back: He exhibits no tenderness.  Neurological: He is alert and oriented to person, place, and time.  Reflex Scores:      Patellar reflexes are 2+ on the right side and 2+ on  the left side. Bilateral LE strength is 5/5  Skin: Skin is warm and dry.  Psychiatric: He has a normal mood and affect. His behavior is normal. Thought content normal.          Assessment & Plan:  Lumbar strain- will rx with meloxicam and flexeril prn. Pt advised to follow up if symptoms worsen or do not improve.   Thrombocytopenia- hx, follow up CBC reviewed- notes normal platelets.  Monitor.  Nausea- has been present for a few days no vomiting. Likely due to mild gastroenteritis. Advised pt to call if symptoms worsen or do not improve.  CBC and LFT  Unremarkable.

## 2014-11-07 NOTE — Patient Instructions (Signed)
Please complete lab work prior to leaving. Star meloxicam (antifinflammatory for pain and flexeril- muscle relaxer) as needed for back pain. Call if nausea worsens or if not improved in 1 week.

## 2014-11-12 NOTE — Telephone Encounter (Signed)
Pt requesting call for lab results.

## 2014-11-12 NOTE — Telephone Encounter (Signed)
Results were sent to pt via mychart on 11/07/14.  Notified pt and he voices understanding.

## 2015-01-01 ENCOUNTER — Telehealth: Payer: Self-pay | Admitting: Family

## 2015-02-04 NOTE — Telephone Encounter (Signed)
flu

## 2015-02-27 ENCOUNTER — Ambulatory Visit (INDEPENDENT_AMBULATORY_CARE_PROVIDER_SITE_OTHER): Payer: BLUE CROSS/BLUE SHIELD | Admitting: Family

## 2015-02-27 ENCOUNTER — Encounter: Payer: Self-pay | Admitting: Family

## 2015-02-27 VITALS — BP 139/78 | HR 98 | Temp 98.1°F | Resp 16 | Ht 70.0 in | Wt 193.0 lb

## 2015-02-27 DIAGNOSIS — S161XXA Strain of muscle, fascia and tendon at neck level, initial encounter: Secondary | ICD-10-CM

## 2015-02-27 MED ORDER — MELOXICAM 7.5 MG PO TABS: 7.5000 mg | ORAL_TABLET | Freq: Every day | ORAL | Status: DC

## 2015-02-27 MED FILL — MELOXICAM 7.5 MG TABLET: 7.5 | 14 days supply | Qty: 14 | Fill #0

## 2015-02-27 NOTE — Progress Notes (Signed)
Pre visit review using our clinic review tool, if applicable. No additional management support is needed unless otherwise documented below in the visit note. 

## 2015-02-27 NOTE — Patient Instructions (Signed)
Please start meloxicam once daily for neck pain. You may continue to apply heat as needed. Call if pain worsens, if pain radiates down arm, or if symptoms are not improved in 2 weeks.

## 2015-02-27 NOTE — Progress Notes (Signed)
Subjective:    Patient ID: Blake Baldwin, male    DOB: [birth year], 30 y.o.   MRN: [id number]  HPI  Pt presents with a 10 day hx of right sided neck pain.  Pain is made worse by left lateral flexion.  Symptoms are worsening.  Non-radiating.  Denies numbness/weakness in hands/arms. Has tried excedrin, flexeril and heat.  Mild improvement with these measures.  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    Review of Systems See HPI  Past Medical History  Diagnosis Date  . Bronchitis   . Gallstones     Social History   Social History  . Marital Status: Divorced    Spouse Name: N/A  . Number of Children: N/A  . Years of Education: N/A   Occupational History  . Not on file.   Social History Main Topics  . Smoking status: Former Smoker -- 0.50 packs/day    Types: Cigarettes  . Smokeless tobacco: Former Systems developer  . Alcohol Use: 0.0 oz/week    0 Standard drinks or equivalent per week     Comment: occ  . Drug Use: No  . Sexual Activity: Not Currently   Other Topics Concern  . Not on file   Social History Narrative   In middle of divorce   Has 2 children   Works at [employer]    Past Surgical History  Procedure Laterality Date  . Appendectomy      Family History  Problem Relation Age of Onset  . Hypertension Mother   . Other Mother     hyperaldosteronism and padgett's disease  . Asthma Mother   . Heart disease Maternal Aunt   . Diabetes Maternal Aunt   . Diabetes Maternal Uncle   . Diabetes Cousin     Allergies  Allergen Reactions  . Dilaudid [Hydromorphone Hcl] Itching and Rash    Current Outpatient Prescriptions on File Prior to Visit  Medication Sig Dispense Refill  . omeprazole (PRILOSEC OTC) 20 MG tablet Take 1 tablet (20 mg total) by mouth daily. 30 tablet    No current facility-administered medications on file prior to visit.    BP 139/78 mmHg  Pulse 98  Temp(Src) 98.1 F (36.7 C) (Oral)  Resp 16  Ht 5\' 10"   (1.778 m)  Wt 193 lb (87.544 kg)  BMI 27.69 kg/m2  SpO2 98%       Objective:   Physical Exam  Constitutional: He is oriented to person, place, and time. He appears well-developed and well-nourished. No distress.  HENT:  Head: Normocephalic and atraumatic.  Cardiovascular: Normal rate and regular rhythm.   No murmur heard. Pulmonary/Chest: Effort normal and breath sounds normal. No respiratory distress. He has no wheezes. He has no rales.  Musculoskeletal: He exhibits no edema.       Cervical back: He exhibits no tenderness.  Neurological: He is alert and oriented to person, place, and time.  Skin: Skin is warm and dry.  Psychiatric: He has a normal mood and affect. His behavior is normal. Thought content normal.          Assessment & Plan:  Acute Cervical Strain- trial of meloxicam. OK to continue heating pad prn. He is advised to avoid activities that exacerbate pain. 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 He is advised to let us know if pain worsens,  if he develops numbness/weakness in his arms/hands or if symptoms do not improve.

## 2015-03-10 ENCOUNTER — Telehealth: Payer: Self-pay | Admitting: Family

## 2015-03-10 NOTE — Telephone Encounter (Signed)
VM not setup for flu shot records

## 2015-04-02 ENCOUNTER — Telehealth: Payer: Self-pay | Admitting: Family

## 2015-04-02 NOTE — Telephone Encounter (Signed)
Caller name: Self  Can be reached: [phone]:  Reason for call: Patient called to get the name of a dentist that he can go see. States that [facility] always suggest doctors for him that accept his insurance

## 2015-04-03 NOTE — Telephone Encounter (Signed)
Attempted to reach pt and was unable to leave message as his voice mailbox was not set up yet. Sent [employer].

## 2015-04-03 NOTE — Telephone Encounter (Signed)
He will need to contact his dental insurance to see who is in his network but here are 2 names he can try:  Dr. Mariana Arn [phone] (in [place]) Dr. Harrell Lark [phone] (near our office in [place])

## 2015-04-07 ENCOUNTER — Ambulatory Visit: Payer: BLUE CROSS/BLUE SHIELD | Admitting: Family

## 2015-04-08 ENCOUNTER — Ambulatory Visit: Payer: BLUE CROSS/BLUE SHIELD | Admitting: Family

## 2015-04-08 ENCOUNTER — Telehealth: Payer: Self-pay | Admitting: Family

## 2015-04-08 NOTE — Telephone Encounter (Signed)
Noted and agree. 

## 2015-04-08 NOTE — Telephone Encounter (Signed)
Pt called in to cancel appt his appt. Pt is also requesting a call back from provider . He says that he has a question to ask her.   CB: [phone]

## 2015-04-08 NOTE — Telephone Encounter (Signed)
Spoke with pt, he states he went to an urgent care (doesn't remember the name) yesterday for a knot between his leg and scrotum  and they ruled out inguinal hernia, testicle torsion or infected hair follicle. They told him it could be a strain or contusion. Pt cancelled today's appt as that was what he was coming in for today and wanted to know what he should continue to do. Advised him to follow urgent care instructions and if symptoms worsening or do not improve to schedule appt for evaluation in our office. He will try to contact urgent care and ask them to send note to PCP.

## 2015-04-09 ENCOUNTER — Encounter: Payer: Self-pay | Admitting: Family

## 2015-04-09 NOTE — Telephone Encounter (Signed)
Marked to charge and mailing no show letter °

## 2015-04-09 NOTE — Telephone Encounter (Signed)
Yes please

## 2015-04-09 NOTE — Telephone Encounter (Signed)
Pt was no show 04/08/15 9:30am acute appt, last no show 03/21/14, pt has not rescheduled, charge or no charge?

## 2015-04-10 ENCOUNTER — Encounter: Payer: Self-pay | Admitting: Physician Assistant

## 2015-04-10 ENCOUNTER — Ambulatory Visit (INDEPENDENT_AMBULATORY_CARE_PROVIDER_SITE_OTHER): Payer: BLUE CROSS/BLUE SHIELD | Admitting: Physician Assistant

## 2015-04-10 VITALS — BP 116/60 | HR 84 | Temp 98.0°F | Ht 70.0 in | Wt 193.0 lb

## 2015-04-10 DIAGNOSIS — L729 Follicular cyst of the skin and subcutaneous tissue, unspecified: Secondary | ICD-10-CM | POA: Diagnosis not present

## 2015-04-10 NOTE — Patient Instructions (Signed)
The area seems consistent with a benign sebaceous cyst.  Keep the area clean and dry. Try to apply powders to the area to help wick away moisture.  Try some [place] astringent to the area to help keep pores from getting clogged.  Follow-up if any new symptoms develop.

## 2015-04-10 NOTE — Progress Notes (Signed)
   Patient presents to clinic today c/o know under skin in R groin region, first noticed Monday. Patient denies pain, itching, redness or drainage. Denies trauma or injury. Endorses the knot is sore sometimes if he pushes on it too much. Was seen at a walk-in clinic Tuesday and was told the knot was not infected or related to a hernia. Was told to take an antiinflammatory as needed for soreness in the area. States he wanted area rechecked before the weekend.  Past Medical History  Diagnosis Date  . Bronchitis   . Gallstones     Current Outpatient Prescriptions on File Prior to Visit  Medication Sig Dispense Refill  . Multiple Vitamins-Minerals (THERA-M) TABS Take 1 tablet by mouth daily.    Marland Kitchen omeprazole (PRILOSEC OTC) 20 MG tablet Take 1 tablet (20 mg total) by mouth daily. 30 tablet    No current facility-administered medications on file prior to visit.    Allergies  Allergen Reactions  . Dilaudid [Hydromorphone Hcl] Itching and Rash    Family History  Problem Relation Age of Onset  . Hypertension Mother   . Other Mother     hyperaldosteronism and padgett's disease  . Asthma Mother   . Heart disease Maternal Aunt   . Diabetes Maternal Aunt   . Diabetes Maternal Uncle   . Diabetes Cousin     Social History   Social History  . Marital Status: Divorced    Spouse Name: N/A  . Number of Children: N/A  . Years of Education: N/A   Social History Main Topics  . Smoking status: Former Smoker -- 0.50 packs/day    Types: Cigarettes  . Smokeless tobacco: Former Systems developer  . Alcohol Use: 0.0 oz/week    0 Standard drinks or equivalent per week     Comment: occ  . Drug Use: No  . Sexual Activity: Not Currently   Other Topics Concern  . None   Social History Narrative   In middle of divorce   Has 2 children   Works at [employer] - See HPI.  All other ROS are negative.  BP 116/60 mmHg  Pulse 84  Temp(Src) 98 F (36.7 C) (Oral)  Ht 5\' 10"  (1.778 m)  Wt 193  lb (87.544 kg)  BMI 27.69 kg/m2  SpO2 98%  Physical Exam  Constitutional: He is oriented to person, place, and time and well-developed, well-nourished, and in no distress.  HENT:  Head: Normocephalic and atraumatic.  Eyes: Conjunctivae are normal.  Cardiovascular: Normal rate, regular rhythm, normal heart sounds and intact distal pulses.   Pulmonary/Chest: Effort normal.  Neurological: He is alert and oriented to person, place, and time.  Skin: Skin is warm and dry. No rash noted.  1 cm firm but not indurated nodule underneath the skin of R inguinal region just where thigh meets torso without overlying skin changes, fluctuance or drainage. Non-tender. No palpable lymphadenopathy. No hernia noted on examination. Lesion seems consistent with sebaceous cyst.  Psychiatric: Affect normal.  Vitals reviewed.   No results found for this or any previous visit (from the past 2160 hour(s)).  Assessment/Plan: Cyst of skin No sign of infection. Discussed prognosis with patient. Will apply astringent to the area. Will work on keeping area clean and dry. Encouraged patient to avoid touching at the area as this will irritate it. Referral to Dermatology reviewed. Patient declines at present.

## 2015-04-10 NOTE — Progress Notes (Signed)
Pre visit review using our clinic review tool, if applicable. No additional management support is needed unless otherwise documented below in the visit note. 

## 2015-04-10 NOTE — Assessment & Plan Note (Signed)
No sign of infection. Discussed prognosis with patient. Will apply astringent to the area. Will work on keeping area clean and dry. Encouraged patient to avoid touching at the area as this will irritate it. Referral to Dermatology reviewed. Patient declines at present.

## 2015-09-16 ENCOUNTER — Telehealth: Payer: Self-pay | Admitting: Family

## 2015-09-16 DIAGNOSIS — B079 Viral wart, unspecified: Secondary | ICD-10-CM

## 2015-09-16 NOTE — Telephone Encounter (Signed)
See referral from PA, [place] dated 02/26/14 and advise?

## 2015-09-16 NOTE — Telephone Encounter (Signed)
Pt called in to request a referral to dermatology. Pt says that he has a wart on his finger. If possible pt would like to go to the same location as before.

## 2015-09-18 ENCOUNTER — Encounter: Payer: Self-pay | Admitting: Family

## 2015-10-21 ENCOUNTER — Encounter: Payer: Self-pay | Admitting: Family

## 2015-10-21 ENCOUNTER — Other Ambulatory Visit: Payer: Self-pay | Admitting: Family

## 2015-10-21 ENCOUNTER — Ambulatory Visit (INDEPENDENT_AMBULATORY_CARE_PROVIDER_SITE_OTHER): Payer: BLUE CROSS/BLUE SHIELD | Admitting: Family

## 2015-10-21 VITALS — BP 141/86 | Temp 98.4°F | Resp 16 | Ht 70.0 in | Wt 188.2 lb

## 2015-10-21 DIAGNOSIS — N489 Disorder of penis, unspecified: Secondary | ICD-10-CM

## 2015-10-21 NOTE — Patient Instructions (Signed)
Please complete lab work prior to leaving. Apply antibiotic ointment twice daily to the lesion.  Call if area becomes more red/swollen or painful.

## 2015-10-21 NOTE — Progress Notes (Signed)
Pre visit review using our clinic review tool, if applicable. No additional management support is needed unless otherwise documented below in the visit note. 

## 2015-10-21 NOTE — Progress Notes (Signed)
   Subjective:    Patient ID: Blake Baldwin, male    DOB: 24-Oct-1985, 30 y.o.   MRN: [id number]  HPI   Blake Baldwin is a 30 yr old male who presents today with complaint of an "ingrown hair" on his penis.  Notes that lesion "came up like a pimple, but then I squeezed it."  Has been present for about 1 week. Area is mildly tender to the touch.  Review of Systems    see HPI   Past Medical History:  Diagnosis Date  . Bronchitis   . Gallstones      Social History   Social History  . Marital status: Divorced    Spouse name: N/A  . Number of children: N/A  . Years of education: N/A   Occupational History  . Not on file.   Social History Main Topics  . Smoking status: Current Every Day Smoker    Packs/day: 0.50    Types: Cigarettes  . Smokeless tobacco: Former Systems developer  . Alcohol use 0.0 oz/week     Comment: occ  . Drug use: No  . Sexual activity: Not Currently   Other Topics Concern  . Not on file   Social History Narrative   In middle of divorce   Has 2 children   Works at [employer]    Past Surgical History:  Procedure Laterality Date  . APPENDECTOMY      Family History  Problem Relation Age of Onset  . Hypertension Mother   . Other Mother     hyperaldosteronism and padgett's disease  . Asthma Mother   . Heart disease Maternal Aunt   . Diabetes Maternal Aunt   . Diabetes Maternal Uncle   . Diabetes Cousin     Allergies  Allergen Reactions  . Dilaudid [Hydromorphone Hcl] Itching and Rash    Current Outpatient Prescriptions on File Prior to Visit  Medication Sig Dispense Refill  . omeprazole (PRILOSEC OTC) 20 MG tablet Take 1 tablet (20 mg total) by mouth daily. 30 tablet    No current facility-administered medications on file prior to visit.     BP (!) 141/86 (BP Location: Right Arm, Cuff Size: Normal)   Temp 98.4 F (36.9 C) (Oral)   Resp 16   Ht 5\' 10"  (1.778 m)   Wt 188 lb 3.2 oz (85.4 kg)   SpO2 100% Comment: room air  BMI 27.00 kg/m     Objective:   Physical Exam  Constitutional: He appears well-developed and well-nourished. No distress.  Genitourinary:     Genitourinary Comments: Small ulcerated lesion noted on penile shaft  Psychiatric: He has a normal mood and affect. His behavior is normal. Judgment and thought content normal.          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 Advised patient to apply antibiotic ointment twice daily to the lesion until healed. Call if area becomes more red/swollen or painful. 15 minutes spent with patient today. >50% of this time was spent counseling patient on HSV causes/treatment etc.

## 2015-10-22 ENCOUNTER — Encounter: Payer: Self-pay | Admitting: Family

## 2015-10-22 LAB — HSV 1 ANTIBODY, IGG: HSV 1 Glycoprotein G Ab, IgG: <0.9 Index (ref <0.90)

## 2015-10-22 LAB — HSV 2 ANTIBODY, IGG: HSV 2 Glycoprotein G Ab, IgG: <0.9 Index (ref <0.90)

## 2015-10-23 ENCOUNTER — Telehealth: Payer: Self-pay | Admitting: Family

## 2015-10-23 DIAGNOSIS — N489 Disorder of penis, unspecified: Secondary | ICD-10-CM

## 2015-10-23 LAB — HSV(HERPES SIMPLEX VRS) I + II AB-IGM

## 2015-10-23 NOTE — Telephone Encounter (Signed)
Patient called for lab results, he stated he does not have a mychart. Patient stated he would call back on Monday.

## 2015-10-23 NOTE — Telephone Encounter (Signed)
Relation to [id number] Call back [phone]   Reason for call:  Patient inquiring about lab results. Please advise

## 2015-10-23 NOTE — Telephone Encounter (Signed)
Spoke with pt. He is just wanting to see if we had received final result yet. Advised him it is still preliminary and we will notify him once it is final. He states he is not currently using mychart. I asked if he wanted me to regenerate a new code and he states he will just do that when he calls back on Monday. Acct deactivated. Please advise when results is final.

## 2015-10-23 NOTE — Telephone Encounter (Signed)
Per Jonelle Sidle, pt was advised that results were sent to him yesterday via mychart and he is aware.

## 2015-10-26 NOTE — Telephone Encounter (Signed)
Spoke with lab and was told that IGM testing will be final in 2 more days. Pt is aware. Please advise when result is final.

## 2015-10-28 NOTE — Telephone Encounter (Signed)
Per verbal from [place] (lab), results not yet final due to lab processing delay.

## 2015-10-28 NOTE — Telephone Encounter (Signed)
Notified pt of delay and advised him we will call him once result is complete.

## 2015-10-28 NOTE — Telephone Encounter (Signed)
Pt called in to follow up on lab results.    CB: [phone]

## 2015-11-02 NOTE — Telephone Encounter (Signed)
Received notice from [facility] that test did not get sent to outside lab for testing (their error). Specimen will need to be redrawn. Attempted to notify pt and received message that voice mailbox has not been set up yet. Will try again tomorrow.

## 2015-11-02 NOTE — Telephone Encounter (Signed)
[name] I spoke with Lambert Keto at [facility] about patients HSV 1 &2 IgM results, she has notified me that the patient will have to be recollected because the new test code was added but not processed by the Lab in time before the blood expired and on one contacted our office even after I had been in contact with their lab several times. I am in the process of contacting our office Rep about the matter. KMP

## 2015-11-03 NOTE — Telephone Encounter (Signed)
Attempted to reach pt and received message that voice mailbox has not been set up yet.  

## 2015-11-04 NOTE — Telephone Encounter (Signed)
Attempted to reach pt. Received message that voice mailbox has not been set up yet. Letter mailed to pt.

## 2015-11-06 NOTE — Addendum Note (Signed)
Addended by: Kelle Darting A on: 11/06/2015 01:11 PM   Modules accepted: Orders

## 2015-11-06 NOTE — Telephone Encounter (Signed)
Notified pt and scheduled lab appt for Monday, 11/09/15 at 10:45am. Future order entered.

## 2015-11-06 NOTE — Telephone Encounter (Signed)
Pt returned call for lab results. Please assist.

## 2015-11-09 ENCOUNTER — Other Ambulatory Visit: Payer: BLUE CROSS/BLUE SHIELD

## 2015-11-09 DIAGNOSIS — N489 Disorder of penis, unspecified: Secondary | ICD-10-CM

## 2015-11-12 LAB — HSV 1/2 AB (IGM), IFA W/RFLX TITER

## 2015-11-13 LAB — HSV 1/2 AB (IGM), IFA W/RFLX TITER
HSV 1 IgM Screen: NEGATIVE
HSV 2 IgM Screen: NEGATIVE

## 2015-11-25 ENCOUNTER — Ambulatory Visit (INDEPENDENT_AMBULATORY_CARE_PROVIDER_SITE_OTHER): Payer: BLUE CROSS/BLUE SHIELD | Admitting: Medical

## 2015-11-25 ENCOUNTER — Encounter: Payer: Self-pay | Admitting: Medical

## 2015-11-25 VITALS — BP 122/86 | HR 80 | Temp 98.8°F | Resp 16 | Ht 70.0 in | Wt 195.2 lb

## 2015-11-25 DIAGNOSIS — J209 Acute bronchitis, unspecified: Secondary | ICD-10-CM

## 2015-11-25 MED ORDER — DOXYCYCLINE HYCLATE 100 MG PO TABS: 100.0000 mg | ORAL_TABLET | Freq: Two times a day (BID) | ORAL | 0 refills | Status: DC

## 2015-11-25 MED ORDER — BENZONATATE 100 MG PO CAPS: 100.0000 mg | ORAL_CAPSULE | Freq: Three times a day (TID) | ORAL | 0 refills | Status: DC | PRN

## 2015-11-25 MED FILL — DOXYCYCLINE HYCLATE 100 MG: 100 | 10 days supply | Qty: 20 | Fill #0

## 2015-11-25 MED FILL — BENZONATATE 100 MG CAPSULE: 100 | 7 days supply | Qty: 21 | Fill #0

## 2015-11-25 NOTE — Patient Instructions (Addendum)
You appear to have bronchitis. Rest hydrate and tylenol for fever. I am prescribing cough medicine benzonatate, and doxycycline antibiotic. For your nasal congestion you could use otc nasal steroid such as flonase.  I do want you to get cxr today to assess lower lobes.(Declined and that is ok. But if by early next week not significantly improved then get cxr)  Follow up in 7-10 days or as needed

## 2015-11-25 NOTE — Progress Notes (Signed)
Pre visit review using our clinic review tool, if applicable. No additional management support is needed unless otherwise documented below in the visit note/SLS  

## 2015-11-25 NOTE — Progress Notes (Signed)
Subjective:    Patient ID: Blake Baldwin, male    DOB: 10/12/1985, 30 y.o.   MRN: [id number]  HPI  Pt in for cough, chest congestion and fatigue. Fatigue started on Sunday.   Pt states early am will bring up mucous when he coughed. No fever, no chills or sweats.   No wheezing.   Pt is a smoker about 1/2 pack a day.   No diffuse bodyaches.   Pt gets bronchitis in past when smoked. He stopped smoking for 2 years recently restarted in May.   Review of Systems  Constitutional: Positive for fatigue. Negative for chills and fever.  HENT: Positive for congestion. Negative for dental problem, ear pain, rhinorrhea, sinus pain and sinus pressure.   Respiratory: Positive for cough. Negative for chest tightness, shortness of breath and wheezing.   Cardiovascular: Negative for chest pain and palpitations.  Gastrointestinal: Negative for abdominal pain, blood in stool, constipation and diarrhea.  Musculoskeletal: Negative for back pain and gait problem.  Skin: Negative for rash.  Neurological: Negative for dizziness, weakness, numbness and headaches.  Hematological: Negative for adenopathy. Does not bruise/bleed easily.  Psychiatric/Behavioral: Negative for behavioral problems and confusion. The patient is not nervous/anxious.    Past Medical History:  Diagnosis Date  . Bronchitis   . Gallstones      Social History   Social History  . Marital status: Divorced    Spouse name: N/A  . Number of children: N/A  . Years of education: N/A   Occupational History  . Not on file.   Social History Main Topics  . Smoking status: Current Every Day Smoker    Packs/day: 0.50    Types: Cigarettes  . Smokeless tobacco: Former Systems developer  . Alcohol use 0.0 oz/week     Comment: occ  . Drug use: No  . Sexual activity: Not Currently   Other Topics Concern  . Not on file   Social History Narrative   In middle of divorce   Has 2 children   Works at [employer]    Past Surgical History:    Procedure Laterality Date  . APPENDECTOMY      Family History  Problem Relation Age of Onset  . Hypertension Mother   . Other Mother     hyperaldosteronism and padgett's disease  . Asthma Mother   . Heart disease Maternal Aunt   . Diabetes Maternal Aunt   . Diabetes Maternal Uncle   . Diabetes Cousin     Allergies  Allergen Reactions  . Dilaudid [Hydromorphone Hcl] Itching and Rash    Current Outpatient Prescriptions on File Prior to Visit  Medication Sig Dispense Refill  . omeprazole (PRILOSEC OTC) 20 MG tablet Take 1 tablet (20 mg total) by mouth daily. 30 tablet    No current facility-administered medications on file prior to visit.     BP 122/86 (BP Location: Left Arm, Patient Position: Sitting, Cuff Size: Large)   Pulse 80   Temp 98.8 F (37.1 C) (Oral)   Resp 16   Ht 5\' 10"  (1.778 m)   Wt 195 lb 4 oz (88.6 kg)   SpO2 98%   BMI 28.02 kg/m       Objective:   Physical Exam  General  Mental Status - Alert. General Appearance - Well groomed. Not in acute distress.  Skin Rashes- No Rashes.  HEENT Head- Normal. Ear Auditory Canal - Left- Normal. Right - Normal.Tympanic Membrane- Left- Normal. Right- Normal. Eye Sclera/Conjunctiva- Left- Normal. Right-  Normal. Nose & Sinuses Nasal Mucosa- Left-  Boggy and Congested. Right-  Boggy and  Congested.Bilateral no  maxillary and no  frontal sinus pressure. Mouth & Throat Lips: Upper Lip- Normal: no dryness, cracking, pallor, cyanosis, or vesicular eruption. Lower Lip-Normal: no dryness, cracking, pallor, cyanosis or vesicular eruption. Buccal Mucosa- Bilateral- No Aphthous ulcers. Oropharynx- No Discharge or Erythema. Tonsils: Characteristics- Bilateral- No Erythema or Congestion. Size/Enlargement- Bilateral- No enlargement. Discharge- bilateral-None.  Neck Neck- Supple. No Masses.   Chest and Lung Exam Auscultation: Breath Sounds:-even and unlabored. Faint rough breath sounds left lung  base.  Cardiovascular Auscultation:Rythm- Regular, rate and rhythm. Murmurs & Other Heart Sounds:Ausculatation of the heart reveal- No Murmurs.  Lymphatic Head & Neck General Head & Neck Lymphatics: Bilateral: Description- No Localized lymphadenopathy.       Assessment & Plan:  You appear to have bronchitis. Rest hydrate and tylenol for fever. I am prescribing cough medicine benzonatate, and doxycycline antibiotic. For your nasal congestion you could use otc nasal steroid such as flonase.  I do want you to get cxr today to assess lower lobes.(Pt declined xray today). But if by early next week not significantly improved then get cxr.  Follow up in 7-10 days or as needed

## 2016-01-13 ENCOUNTER — Encounter: Payer: Self-pay | Admitting: Medical

## 2016-01-13 ENCOUNTER — Ambulatory Visit (HOSPITAL_BASED_OUTPATIENT_CLINIC_OR_DEPARTMENT_OTHER)
Admission: RE | Admit: 2016-01-13 | Discharge: 2016-01-13 | Disposition: A | Discharge status: Home / Self Care | Payer: BLUE CROSS/BLUE SHIELD | Source: Ambulatory Visit | Attending: Medical | Admitting: Medical

## 2016-01-13 ENCOUNTER — Ambulatory Visit (INDEPENDENT_AMBULATORY_CARE_PROVIDER_SITE_OTHER): Payer: BLUE CROSS/BLUE SHIELD | Admitting: Medical

## 2016-01-13 VITALS — BP 102/72 | HR 83 | Temp 98.1°F | Resp 16 | Ht 70.0 in | Wt 196.5 lb

## 2016-01-13 DIAGNOSIS — M25571 Pain in right ankle and joints of right foot: Secondary | ICD-10-CM | POA: Insufficient documentation

## 2016-01-13 DIAGNOSIS — J0100 Acute maxillary sinusitis, unspecified: Secondary | ICD-10-CM | POA: Diagnosis not present

## 2016-01-13 DIAGNOSIS — J209 Acute bronchitis, unspecified: Secondary | ICD-10-CM | POA: Diagnosis not present

## 2016-01-13 MED ORDER — DOXYCYCLINE HYCLATE 100 MG PO TABS
100.0000 mg | ORAL_TABLET | Freq: Two times a day (BID) | ORAL | 0 refills | Status: DC
Start: 2016-01-13 — End: 2016-04-01

## 2016-01-13 MED ORDER — DICLOFENAC SODIUM 75 MG PO TBEC: 75.0000 mg | DELAYED_RELEASE_TABLET | Freq: Two times a day (BID) | ORAL | 0 refills | Status: DC

## 2016-01-13 MED ORDER — BENZONATATE 100 MG PO CAPS: 100.0000 mg | ORAL_CAPSULE | Freq: Three times a day (TID) | ORAL | 0 refills | Status: DC | PRN

## 2016-01-13 MED ORDER — FLUTICASONE PROPIONATE 50 MCG/ACT NA SUSP: 2.0000 | Freq: Every day | NASAL | 1 refills | Status: DC

## 2016-01-13 MED FILL — DICLOFENAC SOD 75 MG TAB EC: 75 | 15 days supply | Qty: 30 | Fill #0

## 2016-01-13 MED FILL — DOXYCYCLINE HYCLATE 100 MG: 100 | 10 days supply | Qty: 20 | Fill #0

## 2016-01-13 NOTE — Progress Notes (Signed)
Pre visit review using our clinic review tool, if applicable. No additional management support is needed unless otherwise documented below in the visit note/SLS  

## 2016-01-13 NOTE — Patient Instructions (Addendum)
You appear to have a sinus infection(possible early bronchitis as well. I am prescribing doxycycline antibiotic for the infection. To help with the nasal congestion I prescribed nasal steroid flonase. For your associated cough, I prescribed cough medicine benzonatate.  For your foot pain rx diclofenac. Advise Dr. Darrick Grinder full length heel pad. Get xray of foot today. If pain persists and intermittent then refer to podiatrist.(explained plantar fascitis is possible but other conditions to consider as well if not improving).  Rest, hydrate, tylenol for fever.  Follow up in 7 days or as needed.   Plantar Fasciitis Plantar fasciitis is a painful foot condition that affects the heel. It occurs when the band of tissue that connects the toes to the heel bone (plantar fascia) becomes irritated. This can happen after exercising too much or doing other repetitive activities (overuse injury). The pain from plantar fasciitis can range from mild irritation to severe pain that makes it difficult for you to walk or move. The pain is usually worse in the morning or after you have been sitting or lying down for a while. CAUSES This condition may be caused by:  Standing for long periods of time.  Wearing shoes that do not fit.  Doing high-impact activities, including running, aerobics, and ballet.  Being overweight.  Having an abnormal way of walking (gait).  Having tight calf muscles.  Having high arches in your feet.  Starting a new athletic activity. SYMPTOMS The main symptom of this condition is heel pain. Other symptoms include:  Pain that gets worse after activity or exercise.  Pain that is worse in the morning or after resting.  Pain that goes away after you walk for a few minutes. DIAGNOSIS This condition may be diagnosed based on your signs and symptoms. Your health care provider will also do a physical exam to check for:  A tender area on the bottom of your foot.  A high arch in your  foot.  Pain when you move your foot.  Difficulty moving your foot. You may also need to have imaging studies to confirm the diagnosis. These can include:  X-rays.  Ultrasound.  MRI. TREATMENT  Treatment for plantar fasciitis depends on the severity of the condition. Your treatment may include:  Rest, ice, and over-the-counter pain medicines to manage your pain.  Exercises to stretch your calves and your plantar fascia.  A splint that holds your foot in a stretched, upward position while you sleep (night splint).  Physical therapy to relieve symptoms and prevent problems in the future.  Cortisone injections to relieve severe pain.  Extracorporeal shock wave therapy (ESWT) to stimulate damaged plantar fascia with electrical impulses. It is often used as a last resort before surgery.  Surgery, if other treatments have not worked after 12 months. HOME CARE INSTRUCTIONS  Take medicines only as directed by your health care provider.  Avoid activities that cause pain.  Roll the bottom of your foot over a bag of ice or a bottle of cold water. Do this for 20 minutes, 3-4 times a day.  Perform simple stretches as directed by your health care provider.  Try wearing athletic shoes with air-sole or gel-sole cushions or soft shoe inserts.  Wear a night splint while sleeping, if directed by your health care provider.  Keep all follow-up appointments with your health care provider. PREVENTION   Do not perform exercises or activities that cause heel pain.  Consider finding low-impact activities if you continue to have problems.  Lose weight if  you need to. The best way to prevent plantar fasciitis is to avoid the activities that aggravate your plantar fascia. SEEK MEDICAL CARE IF:  Your symptoms do not go away after treatment with home care measures.  Your pain gets worse.  Your pain affects your ability to move or do your daily activities. This information is not intended to  replace advice given to you by your health care provider. Make sure you discuss any questions you have with your health care provider. Document Released: 09/21/2000 Document Revised: 04/20/2015 Document Reviewed: 11/06/2013 Elsevier Interactive Patient Education  2017 [employer].

## 2016-01-13 NOTE — Progress Notes (Signed)
Subjective:    Patient ID: Blake Baldwin, male    DOB: August 16, 1985, 31 y.o.   MRN: [id number]  HPI   Pt in for recent head/nasal congestion and chest congestion. Hasp pnd with irritated st. Cough is productive and blows out colored mucous from nose. Symptoms for 3 days. Some sinus pressure  No fever, no chills or sweats. No bodyaches.  [place] has some rt foot pain. Intemittent pain for 3wks. Hurts on curtain positions. No trauma or falls. Pt states occasional when he walks up steps will feel transient pain mid portion of foot and describes some pain at metatarsal head region. Describes some pain on plantar flexion.   Review of Systems  HENT: Positive for congestion, sinus pain and sinus pressure.   Respiratory: Positive for cough. Negative for shortness of breath and wheezing.   Cardiovascular: Negative for chest pain and palpitations.  Gastrointestinal: Negative for abdominal pain.  Musculoskeletal: Negative for back pain, myalgias, neck pain and neck stiffness.       Foot pain.  Skin: Negative for rash.  Neurological: Negative for dizziness and headaches.  Hematological: Negative for adenopathy.  Psychiatric/Behavioral: Negative for behavioral problems and confusion.    Past Medical History:  Diagnosis Date  . Bronchitis   . Gallstones      Social History   Social History  . Marital status: Divorced    Spouse name: N/A  . Number of children: N/A  . Years of education: N/A   Occupational History  . Not on file.   Social History Main Topics  . Smoking status: Former Smoker    Packs/day: 0.50    Types: Cigarettes    Quit date: 11/25/2015  . Smokeless tobacco: Former Systems developer  . Alcohol use 0.0 oz/week     Comment: occ  . Drug use: No  . Sexual activity: Not Currently   Other Topics Concern  . Not on file   Social History Narrative   In middle of divorce   Has 2 children   Works at [employer]    Past Surgical History:  Procedure Laterality Date  .  APPENDECTOMY      Family History  Problem Relation Age of Onset  . Hypertension Mother   . Other Mother     hyperaldosteronism and padgett's disease  . Asthma Mother   . Heart disease Maternal Aunt   . Diabetes Maternal Aunt   . Diabetes Maternal Uncle   . Diabetes Cousin     Allergies  Allergen Reactions  . Dilaudid [Hydromorphone Hcl] Itching and Rash    Current Outpatient Prescriptions on File Prior to Visit  Medication Sig Dispense Refill  . omeprazole (PRILOSEC OTC) 20 MG tablet Take 1 tablet (20 mg total) by mouth daily. 30 tablet    No current facility-administered medications on file prior to visit.     BP 102/72 (BP Location: Right Arm, Patient Position: Sitting, Cuff Size: Large)   Pulse 83   Temp 98.1 F (36.7 C) (Oral)   Resp 16   Ht 5\' 10"  (1.778 m)   Wt 196 lb 8 oz (89.1 kg)   SpO2 98%   BMI 28.19 kg/m       Objective:   Physical Exam  General  Mental Status - Alert. General Appearance - Well groomed. Not in acute distress.  Skin Rashes- No Rashes.  HEENT Head- Normal. Ear Auditory Canal - Left- Normal. Right - Normal.Tympanic Membrane- Left- Normal. Right- Normal. Eye Sclera/Conjunctiva- Left- Normal. Right- Normal. Nose &  Sinuses Nasal Mucosa- Left-  Boggy and Congested. Right-  Boggy and  Congested.Bilateral maxillary and frontal sinus pressure. Mouth & Throat Lips: Upper Lip- Normal: no dryness, cracking, pallor, cyanosis, or vesicular eruption. Lower Lip-Normal: no dryness, cracking, pallor, cyanosis or vesicular eruption. Buccal Mucosa- Bilateral- No Aphthous ulcers. Oropharynx- No Discharge or Erythema. Tonsils: Characteristics- Bilateral- No Erythema or Congestion. Size/Enlargement- Bilateral- No enlargement. Discharge- bilateral-None.  Neck Neck- Supple. No Masses.   Chest and Lung Exam Auscultation: Breath Sounds:-Clear even and unlabored.  Cardiovascular Auscultation:Rythm- Regular, rate and rhythm. Murmurs & Other Heart  Sounds:Ausculatation of the heart reveal- No Murmurs.  Lymphatic Head & Neck General Head & Neck Lymphatics: Bilateral: Description- No Localized lymphadenopathy.  Rt foot- no swelling. Some moderate pain directly between distal 4th and 5th metatarsal head region.     Assessment & Plan:  You appear to have a sinus infection(possible early bronchitis as well. I am prescribing doxycycline antibiotic for the infection. To help with the nasal congestion I prescribed nasal steroid flonase. For your associated cough, I prescribed cough medicine benzonatate.  For your foot pain rx diclofenac. Advise Dr. Darrick Grinder full length heel pad. Get xray of foot today. If pain persists and intermittent then refer to podiatrist.  Rest, hydrate, tylenol for fever.  Follow up in 7 days or as needed.

## 2016-01-14 ENCOUNTER — Telehealth: Payer: Self-pay | Admitting: Medical

## 2016-01-14 DIAGNOSIS — M25571 Pain in right ankle and joints of right foot: Secondary | ICD-10-CM

## 2016-01-14 NOTE — Telephone Encounter (Signed)
Referral to podiatry place.

## 2016-04-01 ENCOUNTER — Ambulatory Visit (HOSPITAL_BASED_OUTPATIENT_CLINIC_OR_DEPARTMENT_OTHER)
Admission: RE | Admit: 2016-04-01 | Discharge: 2016-04-01 | Disposition: A | Discharge status: Home / Self Care | Payer: BLUE CROSS/BLUE SHIELD | Source: Ambulatory Visit | Attending: Family Medicine | Admitting: Family Medicine

## 2016-04-01 ENCOUNTER — Other Ambulatory Visit: Payer: Self-pay | Admitting: Family

## 2016-04-01 ENCOUNTER — Ambulatory Visit (INDEPENDENT_AMBULATORY_CARE_PROVIDER_SITE_OTHER): Payer: BLUE CROSS/BLUE SHIELD | Admitting: Family Medicine

## 2016-04-01 ENCOUNTER — Encounter: Payer: Self-pay | Admitting: Family Medicine

## 2016-04-01 VITALS — BP 98/62 | HR 79 | Temp 97.9°F | Resp 16 | Ht 70.0 in | Wt 196.4 lb

## 2016-04-01 DIAGNOSIS — K8020 Calculus of gallbladder without cholecystitis without obstruction: Secondary | ICD-10-CM

## 2016-04-01 DIAGNOSIS — R109 Unspecified abdominal pain: Secondary | ICD-10-CM

## 2016-04-01 LAB — POC URINALSYSI DIPSTICK (AUTOMATED)
BILIRUBIN UA: NEGATIVE
Glucose, UA: NEGATIVE
Leukocytes, UA: NEGATIVE
Nitrite, UA: NEGATIVE
RBC UA: NEGATIVE
Spec Grav, UA: 1.03 (ref 1.030–1.035)
Urobilinogen, UA: NEGATIVE (ref ?–2.0)
pH, UA: 6 (ref 5.0–8.0)

## 2016-04-01 LAB — COMPREHENSIVE METABOLIC PANEL
ALBUMIN: 4.6 g/dL (ref 3.5–5.2)
ALK PHOS: 65 U/L (ref 39–117)
ALT: 17 U/L (ref 0–53)
AST: 11 U/L (ref 0–37)
BILIRUBIN TOTAL: 0.9 mg/dL (ref 0.2–1.2)
BUN: 16 mg/dL (ref 6–23)
CALCIUM: 9.6 mg/dL (ref 8.4–10.5)
CO2: 27 mEq/L (ref 19–32)
Chloride: 105 mEq/L (ref 96–112)
Creatinine, Ser: 0.82 mg/dL (ref 0.40–1.50)
GFR: 117.37 mL/min (ref >60.00)
Glucose, Bld: 89 mg/dL (ref 70–99)
Potassium: 3.7 mEq/L (ref 3.5–5.1)
Sodium: 138 mEq/L (ref 135–145)
TOTAL PROTEIN: 7.3 g/dL (ref 6.0–8.3)

## 2016-04-01 LAB — CBC WITH DIFFERENTIAL/PLATELET
BASOS ABS: 0 10*3/uL (ref 0.0–0.1)
Basophils Relative: 0.5 % (ref 0.0–3.0)
Eosinophils Absolute: 0.4 10*3/uL (ref 0.0–0.7)
Eosinophils Relative: 9 % — ABNORMAL HIGH (ref 0.0–5.0)
HCT: 45.1 % (ref 39.0–52.0)
Hemoglobin: 15.9 g/dL (ref 13.0–17.0)
LYMPHS ABS: 1.4 10*3/uL (ref 0.7–4.0)
Lymphocytes Relative: 31.4 % (ref 12.0–46.0)
MCHC: 35.2 g/dL (ref 30.0–36.0)
MCV: 90.3 fl (ref 78.0–100.0)
MONO ABS: 0.4 10*3/uL (ref 0.1–1.0)
Monocytes Relative: 8.5 % (ref 3.0–12.0)
Neutro Abs: 2.3 10*3/uL (ref 1.4–7.7)
Neutrophils Relative %: 50.6 % (ref 43.0–77.0)
Platelets: 166 10*3/uL (ref 150.0–400.0)
RBC: 4.99 Mil/uL (ref 4.22–5.81)
RDW: 12.7 % (ref 11.5–15.5)
WBC: 4.5 10*3/uL (ref 4.0–10.5)

## 2016-04-01 NOTE — Patient Instructions (Signed)

## 2016-04-01 NOTE — Progress Notes (Signed)
Subjective:  I acted as a Education administrator for Dr. Royden Purl, LPN    Patient ID: Blake Baldwin, male    DOB: 02/19/1985, 31 y.o.   MRN: [id number]  Chief Complaint  Patient presents with  . Abdominal Pain    HPI  Patient is in today for c/o upper right abdominal pain since Monday. Patient report pain in his upper abdomen feel like a dull, and stabbing. Patient also report he was told about 11 years ago he had gallstones.  Patient Care Team: [name], NP as PCP - General (Internal Medicine)   Past Medical History:  Diagnosis Date  . Bronchitis   . Gallstones     Past Surgical History:  Procedure Laterality Date  . APPENDECTOMY      Family History  Problem Relation Age of Onset  . Hypertension Mother   . Other Mother     hyperaldosteronism and padgett's disease  . Asthma Mother   . Heart disease Maternal Aunt   . Diabetes Maternal Aunt   . Diabetes Maternal Uncle   . Diabetes Cousin     Social History   Social History  . Marital status: Divorced    Spouse name: N/A  . Number of children: N/A  . Years of education: N/A   Occupational History  . Not on file.   Social History Main Topics  . Smoking status: Former Smoker    Packs/day: 0.50    Types: Cigarettes    Quit date: 11/25/2015  . Smokeless tobacco: Former Systems developer  . Alcohol use 0.0 oz/week     Comment: occ  . Drug use: No  . Sexual activity: Not Currently   Other Topics Concern  . Not on file   Social History Narrative   In middle of divorce   Has 2 children   Works at [employer]    Outpatient Medications Prior to Visit  Medication Sig Dispense Refill  . omeprazole (PRILOSEC OTC) 20 MG tablet Take 1 tablet (20 mg total) by mouth daily. 30 tablet   . benzonatate (TESSALON) 100 MG capsule Take 1 capsule (100 mg total) by mouth 3 (three) times daily as needed for cough. 21 capsule 0  . diclofenac (VOLTAREN) 75 MG EC tablet Take 1 tablet (75 mg total) by mouth 2 (two) times daily. 30 tablet  0  . doxycycline (VIBRA-TABS) 100 MG tablet Take 1 tablet (100 mg total) by mouth 2 (two) times daily. 20 tablet 0  . fluticasone (FLONASE) 50 MCG/ACT nasal spray Place 2 sprays into both nostrils daily. 16 g 1   No facility-administered medications prior to visit.     Allergies  Allergen Reactions  . Dilaudid [Hydromorphone Hcl] Itching and Rash    Review of Systems  Constitutional: Negative for fever and malaise/fatigue.  HENT: Negative for congestion.   Eyes: Negative for blurred vision.  Respiratory: Negative for shortness of breath.   Cardiovascular: Negative for chest pain, palpitations and leg swelling.  Gastrointestinal: Positive for abdominal pain. Negative for blood in stool, constipation, diarrhea and nausea.  Genitourinary: Negative for dysuria and frequency.  Musculoskeletal: Negative for falls.  Skin: Negative for rash.  Neurological: Negative for dizziness, loss of consciousness and headaches.  Endo/Heme/Allergies: Negative for environmental allergies.  Psychiatric/Behavioral: Negative for depression. The patient is not nervous/anxious.        Objective:    Physical Exam  Constitutional: He is oriented to person, place, and time. Vital signs are normal. He appears well-developed and well-nourished. He is  sleeping.  HENT:  Head: Normocephalic and atraumatic.  Mouth/Throat: Oropharynx is clear and moist.  Eyes: EOM are normal. Pupils are equal, round, and reactive to light.  Neck: Normal range of motion. Neck supple. No thyromegaly present.  Cardiovascular: Normal rate and regular rhythm.   No murmur heard. Pulmonary/Chest: Effort normal and breath sounds normal. No respiratory distress. He has no wheezes. He has no rales. He exhibits no tenderness.  Abdominal: Soft. He exhibits no distension. There is tenderness in the right upper quadrant. There is no rebound and no guarding.  Musculoskeletal: He exhibits no edema or tenderness.  Neurological: He is alert and  oriented to person, place, and time.  Skin: Skin is warm and dry.  Psychiatric: He has a normal mood and affect. His behavior is normal. Judgment and thought content normal.  Nursing note and vitals reviewed.   BP 98/62 (BP Location: Left Arm, Patient Position: Sitting, Cuff Size: Normal)   Pulse 79   Temp 97.9 F (36.6 C) (Oral)   Resp 16   Ht 5\' 10"  (1.778 m)   Wt 196 lb 6.4 oz (89.1 kg)   SpO2 95%   BMI 28.18 kg/m  Wt Readings from Last 3 Encounters:  04/01/16 196 lb 6.4 oz (89.1 kg)  01/13/16 196 lb 8 oz (89.1 kg)  11/25/15 195 lb 4 oz (88.6 kg)   BP Readings from Last 3 Encounters:  04/01/16 98/62  01/13/16 102/72  11/25/15 122/86     Immunization History  Administered Date(s) Administered  . Tdap 01/18/2012    Health Maintenance  Topic Date Due  . HIV Screening  05/25/2001  . INFLUENZA VACCINE  04/09/2016 (Originally 08/11/2015)  . TETANUS/TDAP  01/17/2022    Lab Results  Component Value Date   WBC 4.5 04/01/2016   HGB 15.9 04/01/2016   HCT 45.1 04/01/2016   PLT 166.0 04/01/2016   GLUCOSE 89 04/01/2016   CHOL 161 01/18/2012   TRIG 82 01/18/2012   HDL 34 (L) 01/18/2012   LDLCALC 111 (H) 01/18/2012   ALT 17 04/01/2016   AST 11 04/01/2016   NA 138 04/01/2016   K 3.7 04/01/2016   CL 105 04/01/2016   CREATININE 0.82 04/01/2016   BUN 16 04/01/2016   CO2 27 04/01/2016   TSH 0.55 09/18/2013    Lab Results  Component Value Date   TSH 0.55 09/18/2013   Lab Results  Component Value Date   WBC 4.5 04/01/2016   HGB 15.9 04/01/2016   HCT 45.1 04/01/2016   MCV 90.3 04/01/2016   PLT 166.0 04/01/2016   Lab Results  Component Value Date   NA 138 04/01/2016   K 3.7 04/01/2016   CO2 27 04/01/2016   GLUCOSE 89 04/01/2016   BUN 16 04/01/2016   CREATININE 0.82 04/01/2016   BILITOT 0.9 04/01/2016   ALKPHOS 65 04/01/2016   AST 11 04/01/2016   ALT 17 04/01/2016   PROT 7.3 04/01/2016   ALBUMIN 4.6 04/01/2016   CALCIUM 9.6 04/01/2016   GFR 117.37  04/01/2016   Lab Results  Component Value Date   CHOL 161 01/18/2012   Lab Results  Component Value Date   HDL 34 (L) 01/18/2012   Lab Results  Component Value Date   LDLCALC 111 (H) 01/18/2012   Lab Results  Component Value Date   TRIG 82 01/18/2012   Lab Results  Component Value Date   CHOLHDL 4.7 01/18/2012   No results found for: HGBA1C       Assessment &  Plan:   Problem List Items Addressed This Visit    None    Visit Diagnoses    Abdominal pain, unspecified abdominal location    -  Primary   Relevant Orders   POCT Urinalysis Dipstick (Automated) (Completed)   US Abdomen Complete (Completed)   CBC with Differential/Platelet (Completed)   Comprehensive metabolic panel (Completed)    if pain worsens -- go to ER  I have discontinued Mr. Lawniczak's doxycycline, benzonatate, fluticasone, and diclofenac. I am also having him maintain his omeprazole.  No orders of the defined types were placed in this encounter.   CMA served as Education administrator during this visit. History, Physical and Plan performed by medical provider. Documentation and orders reviewed and attested to.  Ann Held, DO   Patient ID: Blake Baldwin, male   DOB: [birth year], 31 y.o.   MRN: [id number]

## 2016-04-01 NOTE — Progress Notes (Signed)
Pre visit review using our clinic review tool, if applicable. No additional management support is needed unless otherwise documented below in the visit note. 

## 2016-04-04 ENCOUNTER — Other Ambulatory Visit: Payer: Self-pay | Admitting: Family

## 2016-04-04 ENCOUNTER — Telehealth: Payer: Self-pay | Admitting: Family

## 2016-04-04 DIAGNOSIS — R898 Other abnormal findings in specimens from other organs, systems and tissues: Secondary | ICD-10-CM

## 2016-04-04 DIAGNOSIS — D721 Eosinophilia: Principal | ICD-10-CM

## 2016-04-04 NOTE — Telephone Encounter (Signed)
Pt returned call for lab results.    CB: [phone]

## 2016-04-04 NOTE — Telephone Encounter (Signed)
See result notes. 

## 2016-05-25 ENCOUNTER — Encounter (HOSPITAL_BASED_OUTPATIENT_CLINIC_OR_DEPARTMENT_OTHER): Payer: Self-pay | Admitting: *Deleted

## 2016-05-25 ENCOUNTER — Emergency Department (HOSPITAL_BASED_OUTPATIENT_CLINIC_OR_DEPARTMENT_OTHER)
Admission: EM | Admit: 2016-05-25 | Discharge: 2016-05-25 | Disposition: A | Discharge status: Home / Self Care | Payer: BLUE CROSS/BLUE SHIELD | Attending: Emergency Medicine | Admitting: Emergency Medicine

## 2016-05-25 DIAGNOSIS — Z79899 Other long term (current) drug therapy: Secondary | ICD-10-CM | POA: Diagnosis not present

## 2016-05-25 DIAGNOSIS — Z87891 Personal history of nicotine dependence: Secondary | ICD-10-CM | POA: Insufficient documentation

## 2016-05-25 DIAGNOSIS — R197 Diarrhea, unspecified: Secondary | ICD-10-CM | POA: Diagnosis not present

## 2016-05-25 LAB — COMPREHENSIVE METABOLIC PANEL WITH GFR
ALT: 21 U/L (ref 17–63)
AST: 15 U/L (ref 15–41)
Albumin: 4.4 g/dL (ref 3.5–5.0)
Alkaline Phosphatase: 69 U/L (ref 38–126)
Anion gap: 8 (ref 5–15)
BUN: 12 mg/dL (ref 6–20)
CO2: 25 mmol/L (ref 22–32)
Calcium: 9 mg/dL (ref 8.9–10.3)
Chloride: 105 mmol/L (ref 101–111)
Creatinine, Ser: 0.81 mg/dL (ref 0.61–1.24)
GFR calc Af Amer: >60 mL/min
GFR calc non Af Amer: >60 mL/min
Glucose, Bld: 101 mg/dL — ABNORMAL HIGH (ref 65–99)
Potassium: 3.8 mmol/L (ref 3.5–5.1)
Sodium: 138 mmol/L (ref 135–145)
Total Bilirubin: 0.7 mg/dL (ref 0.3–1.2)
Total Protein: 7.2 g/dL (ref 6.5–8.1)

## 2016-05-25 LAB — CBC WITH DIFFERENTIAL/PLATELET
Basophils Absolute: 0 K/uL (ref 0.0–0.1)
Basophils Relative: 0 %
Eosinophils Absolute: 0.4 K/uL (ref 0.0–0.7)
Eosinophils Relative: 8 %
HCT: 42.1 % (ref 39.0–52.0)
Hemoglobin: 15.4 g/dL (ref 13.0–17.0)
Lymphocytes Relative: 25 %
Lymphs Abs: 1.1 K/uL (ref 0.7–4.0)
MCH: 32 pg (ref 26.0–34.0)
MCHC: 36.6 g/dL — ABNORMAL HIGH (ref 30.0–36.0)
MCV: 87.5 fL (ref 78.0–100.0)
Monocytes Absolute: 0.4 K/uL (ref 0.1–1.0)
Monocytes Relative: 9 %
Neutro Abs: 2.7 K/uL (ref 1.7–7.7)
Neutrophils Relative %: 59 %
Platelets: 143 K/uL — ABNORMAL LOW (ref 150–400)
RBC: 4.81 MIL/uL (ref 4.22–5.81)
RDW: 12.1 % (ref 11.5–15.5)
WBC: 4.6 K/uL (ref 4.0–10.5)

## 2016-05-25 MED ORDER — CHOLESTYRAMINE 4 G PO PACK: 4.0000 g | PACK | Freq: Three times a day (TID) | ORAL | 12 refills | Status: DC

## 2016-05-25 MED ORDER — LIDOCAINE HCL 2 % EX GEL
1.0000 "application " | Freq: Once | CUTANEOUS | Status: AC
  Administered 2016-05-25: 1 via TOPICAL
  Filled 2016-05-25: qty 20

## 2016-05-25 MED ORDER — SODIUM CHLORIDE 0.9 % IV BOLUS (SEPSIS)
500.0000 mL | Freq: Once | INTRAVENOUS | Status: AC
  Administered 2016-05-25: 500 mL via INTRAVENOUS

## 2016-05-25 MED ORDER — HYDROCODONE-ACETAMINOPHEN 5-325 MG PO TABS
1.0000 | ORAL_TABLET | Freq: Once | ORAL | Status: AC
  Administered 2016-05-25: 1 via ORAL
  Filled 2016-05-25: qty 1

## 2016-05-25 MED ORDER — MENTHOL-ZINC OXIDE 0.44-20.6 % EX OINT: 1.0000 "application " | TOPICAL_OINTMENT | CUTANEOUS | 0 refills | Status: DC | PRN

## 2016-05-25 MED FILL — CHOLESTYRAMINE PACKET: 4 | 20 days supply | Qty: 60 | Fill #0

## 2016-05-25 NOTE — ED Triage Notes (Signed)
Pt reports diarrhea since yesterday, states "my rectum is on fire!" from wiping with toilet tissue, states he has not used wet wipes or any tucks pads or creams to help his rectal pain and soreness. Denies any abd pain.

## 2016-05-25 NOTE — ED Provider Notes (Signed)
[facility] Provider Note   CSN: [id number] Arrival date & time: 05/25/16  0920     History   Chief Complaint Chief Complaint  Patient presents with  . Diarrhea    HPI Blake Baldwin is a 31 y.o. male s/p cholecystectomy on 05/06/16 who presents to the ED today complaining of diarrhea and rectal pain. Patient states that after he had a skull better surgery he had been taking narcotics intermittently for postoperative pain. He been struggling with intermittent constipation and diarrhea since that time. He has since stopped taking his narcotics and beginning yesterday his diarrhea got significantly worse. Patient now feels like his rectum is very raw and he is having discomfort even sitting down. He has not tried taking anything for his symptoms. He denies any melena, hematochezia, nausea, vomiting, fevers, abdominal pain. He has not taken any recent antibiotics. No recent travel.  Patient had a colonoscopy 10 years ago with concern for Crohn's disease but this was ruled out.  HPI  Past Medical History:  Diagnosis Date  . Bronchitis   . Gallstones     Patient Active Problem List   Diagnosis Date Noted  . Cyst of skin 04/10/2015  . Right shoulder strain 06/18/2014  . Wrist pain 05/30/2014  . Folliculitis 44/81/8563  . Wart 02/26/2014  . Hemoptysis 11/13/2013  . Acute upper respiratory infection 11/04/2013  . Insect bite 09/18/2013  . Weight gain 09/18/2013  . Musculoskeletal neck pain 06/07/2013  . Shoulder mass 05/08/2013  . GERD (gastroesophageal reflux disease) 06/22/2012  . Routine general medical examination at a health care facility 01/18/2012  . Nevus of eye 01/18/2012  . Thrombocytopenia ([place]) 01/18/2012  . Tobacco abuse 01/18/2012    Past Surgical History:  Procedure Laterality Date  . APPENDECTOMY         Home Medications    Prior to Admission medications   Medication Sig Start Date End Date Taking? Authorizing Provider  omeprazole  (PRILOSEC OTC) 20 MG tablet Take 1 tablet (20 mg total) by mouth daily. 06/22/12   [name], NP    Family History Family History  Problem Relation Age of Onset  . Hypertension Mother   . Other Mother        hyperaldosteronism and padgett's disease  . Asthma Mother   . Heart disease Maternal Aunt   . Diabetes Maternal Aunt   . Diabetes Maternal Uncle   . Diabetes Cousin     Social History Social History  Substance Use Topics  . Smoking status: Former Smoker    Packs/day: 0.50    Types: Cigarettes    Quit date: 11/25/2015  . Smokeless tobacco: Former Systems developer  . Alcohol use 0.0 oz/week     Comment: occ     Allergies   Dilaudid [hydromorphone hcl]   Review of Systems Review of Systems  All other systems reviewed and are negative.    Physical Exam Updated Vital Signs BP (!) 144/88 (BP Location: Right Arm)   Pulse 97   Temp 97.9 F (36.6 C) (Oral)   Resp 18   SpO2 100%   Physical Exam  Constitutional: He is oriented to person, place, and time. He appears well-developed and well-nourished. No distress.  HENT:  Head: Normocephalic and atraumatic.  Mouth/Throat: No oropharyngeal exudate.  Eyes: Conjunctivae and EOM are normal. Pupils are equal, round, and reactive to light. Right eye exhibits no discharge. Left eye exhibits no discharge. No scleral icterus.  Cardiovascular: Normal rate, regular rhythm, normal heart sounds  and intact distal pulses.  Exam reveals no gallop and no friction rub.   No murmur heard. Pulmonary/Chest: Effort normal and breath sounds normal. No respiratory distress. He has no wheezes. He has no rales. He exhibits no tenderness.  Abdominal: Soft. He exhibits no distension. There is no tenderness. There is no guarding.  Surgical scars are well healed, CDI.  Genitourinary:  Genitourinary Comments: NO external hemorrhoids or gross blood. Skin around rectum appears raw. NO evidence of infection.  Musculoskeletal: Normal range of motion.  He exhibits no edema.  Neurological: He is alert and oriented to person, place, and time.  Skin: Skin is warm and dry. No rash noted. He is not diaphoretic. No erythema. No pallor.  Psychiatric: He has a normal mood and affect. His behavior is normal.  Nursing note and vitals reviewed.    ED Treatments / Results  Labs (all labs ordered are listed, but only abnormal results are displayed) Labs Reviewed  CBC WITH DIFFERENTIAL/PLATELET  COMPREHENSIVE METABOLIC PANEL  URINALYSIS, ROUTINE W REFLEX MICROSCOPIC    EKG  EKG Interpretation None       Radiology No results found.  Procedures Procedures (including critical care time)  Medications Ordered in ED Medications  sodium chloride 0.9 % bolus 500 mL (not administered)  lidocaine (XYLOCAINE) 2 % jelly 1 application (not administered)     Initial Impression / Assessment and Plan / ED Course  I have reviewed the triage vital signs and the nursing notes.  Pertinent labs & imaging results that were available during my care of the patient were reviewed by me and considered in my medical decision making (see chart for details).     Otherwise healthy 31 year old male presents to the ED today complaining of diarrhea and rectal pain. Patient is 2 weeks status post cholecystectomy. He has been having intermittent constipation and diarrhea since his surgery while he was taking narcotics. He is to stop taking these narcotics and is now having significant diarrhea. He is having pain around his rectum, mostly with wiping. On exam the skin around his rectum is raw. NO external hemorrhoids. Patient is not having any nausea, vomiting, fever. He is nontoxic and nonseptic appearing. We'll obtain metabolic panel and CBC. I suspect his diarrhea is related to his gallbladder removal, less likely infectious source. No recent antibiotics use. He is not having any melena or hematochezia.  Labs work without any metabolic derangement. No leukocytosis.  No episodes of diarrhea while in the ED. Pt will be discharged with Cholestyramine and calmoseptine for topical relief. Pt has home narcotics taht he may take as needed. He is instructed to follow up with his PCP if diarrhea does not improve with this medication in 1 week. Return precautions outlined in patient discharge instructions.   Patient was discussed with Dr. Jeneen Rinks who agrees with the treatment plan.    Final Clinical Impressions(s) / ED Diagnoses   Final diagnoses:  Diarrhea, unspecified type    New Prescriptions New Prescriptions   No medications on file     Wynelle Fanny 05/25/16 1302    Tanna Furry, MD 05/27/16 1623

## 2016-05-25 NOTE — ED Notes (Signed)
Pt reports he is unable to provide urine sample at this time.  

## 2016-05-25 NOTE — Discharge Instructions (Signed)
Your diarrhea is likely related to previous, but her surgery. Take Questran as prescribed. Apply ointment to affected area as needed. You may take your home narcotics as well as needed for pain. Please follow up with her primary care doctor in 1 week if her symptoms do not improve with this medication. Return to the emergency department if you experience blood in your stool, abdominal pain, fevers.

## 2016-05-25 NOTE — ED Notes (Signed)
Samantha, PA-C in room with pt now.

## 2016-06-27 ENCOUNTER — Ambulatory Visit: Payer: BLUE CROSS/BLUE SHIELD | Admitting: Family Medicine

## 2016-06-27 DIAGNOSIS — Z0289 Encounter for other administrative examinations: Secondary | ICD-10-CM

## 2016-07-05 ENCOUNTER — Other Ambulatory Visit: Payer: BLUE CROSS/BLUE SHIELD

## 2016-10-17 ENCOUNTER — Emergency Department (HOSPITAL_BASED_OUTPATIENT_CLINIC_OR_DEPARTMENT_OTHER)
Admission: EM | Admit: 2016-10-17 | Discharge: 2016-10-17 | Disposition: A | Discharge status: Home / Self Care | Payer: BLUE CROSS/BLUE SHIELD | Attending: Emergency Medicine | Admitting: Emergency Medicine

## 2016-10-17 ENCOUNTER — Encounter (HOSPITAL_BASED_OUTPATIENT_CLINIC_OR_DEPARTMENT_OTHER): Payer: Self-pay | Admitting: Emergency Medicine

## 2016-10-17 ENCOUNTER — Emergency Department (HOSPITAL_BASED_OUTPATIENT_CLINIC_OR_DEPARTMENT_OTHER): Payer: BLUE CROSS/BLUE SHIELD

## 2016-10-17 DIAGNOSIS — B9789 Other viral agents as the cause of diseases classified elsewhere: Secondary | ICD-10-CM | POA: Diagnosis not present

## 2016-10-17 DIAGNOSIS — Z87891 Personal history of nicotine dependence: Secondary | ICD-10-CM | POA: Diagnosis not present

## 2016-10-17 DIAGNOSIS — J069 Acute upper respiratory infection, unspecified: Secondary | ICD-10-CM | POA: Insufficient documentation

## 2016-10-17 DIAGNOSIS — Z79899 Other long term (current) drug therapy: Secondary | ICD-10-CM | POA: Diagnosis not present

## 2016-10-17 DIAGNOSIS — R05 Cough: Secondary | ICD-10-CM | POA: Diagnosis present

## 2016-10-17 MED ORDER — ALBUTEROL SULFATE HFA 108 (90 BASE) MCG/ACT IN AERS
2.0000 | INHALATION_SPRAY | Freq: Once | RESPIRATORY_TRACT | Status: AC
  Administered 2016-10-17: 2 via RESPIRATORY_TRACT
  Filled 2016-10-17: qty 6.7

## 2016-10-17 MED ORDER — PREDNISONE 20 MG PO TABS: ORAL_TABLET | ORAL | 0 refills | Status: DC

## 2016-10-17 MED ORDER — HYDROCODONE-HOMATROPINE 5-1.5 MG/5ML PO SYRP: 5.0000 mL | ORAL_SOLUTION | Freq: Four times a day (QID) | ORAL | 0 refills | Status: DC | PRN

## 2016-10-17 MED ORDER — IPRATROPIUM-ALBUTEROL 0.5-2.5 (3) MG/3ML IN SOLN
3.0000 mL | Freq: Once | RESPIRATORY_TRACT | Status: AC
  Administered 2016-10-17: 3 mL via RESPIRATORY_TRACT

## 2016-10-17 MED ORDER — IPRATROPIUM-ALBUTEROL 0.5-2.5 (3) MG/3ML IN SOLN
RESPIRATORY_TRACT | Status: AC
  Filled 2016-10-17: qty 3

## 2016-10-17 MED FILL — predniSONE 20 MG TABS: 20 | 5 days supply | Qty: 11 | Fill #0

## 2016-10-17 MED FILL — HYDROCODONE-HOMATROPINE SOL: 5-1.5 | 6 days supply | Qty: 120 | Fill #0

## 2016-10-17 NOTE — ED Triage Notes (Signed)
Cough x 1 week

## 2016-10-17 NOTE — ED Provider Notes (Signed)
[facility] [place] [facility] Provider Note   CSN: [id number] Arrival date & time: 10/17/16  8299     History   Chief Complaint Chief Complaint  Patient presents with  . Cough    HPI Blake Baldwin is a 31 y.o. male.  Patient is a 31 year old male who presents with cough. He states he's had a cough for about a week. He also has some runny nose and nasal congestion. He has a sore throat. He feels that it is sore due to coughing. His cough is mostly nonproductive but occasionally coughs up some white sputum. He denies any shortness of breath other than during bad coughing spells. He's had a couple episodes of posttussive emesis. He also has felt wheezy at times. He's had wheezing only a couple times in the past with colds, mostly when he was a young child. He denies any known fevers. No leg pain or swelling.  He has soreness in his chest and abdomen from the coughing. He's been using over-the-counter medicines without improvement in symptoms.      Past Medical History:  Diagnosis Date  . Bronchitis   . Gallstones     Patient Active Problem List   Diagnosis Date Noted  . Cyst of skin 04/10/2015  . Right shoulder strain 06/18/2014  . Wrist pain 05/30/2014  . Folliculitis 37/16/9678  . Wart 02/26/2014  . Hemoptysis 11/13/2013  . Acute upper respiratory infection 11/04/2013  . Insect bite 09/18/2013  . Weight gain 09/18/2013  . Musculoskeletal neck pain 06/07/2013  . Shoulder mass 05/08/2013  . GERD (gastroesophageal reflux disease) 06/22/2012  . Routine general medical examination at a health care facility 01/18/2012  . Nevus of eye 01/18/2012  . Thrombocytopenia ([place]) 01/18/2012  . Tobacco abuse 01/18/2012    Past Surgical History:  Procedure Laterality Date  . APPENDECTOMY         Home Medications    Prior to Admission medications   Medication Sig Start Date End Date Taking? Authorizing Provider  cholestyramine (QUESTRAN) 4 g packet Take 1 packet (4 g total)  by mouth 3 (three) times daily with meals. 05/25/16   Dowless, Samantha Tripp, PA-C  HYDROcodone-homatropine (HYCODAN) 5-1.5 MG/5ML syrup Take 5 mLs by mouth every 6 (six) hours as needed for cough. 10/17/16   Malvin Johns, MD  Menthol-Zinc Oxide (CALMOSEPTINE) 0.44-20.6 % OINT Apply 1 application topically as needed. 05/25/16   Dowless, Aldona Bar Tripp, PA-C  omeprazole (PRILOSEC OTC) 20 MG tablet Take 1 tablet (20 mg total) by mouth daily. 06/22/12   [name], NP  predniSONE (DELTASONE) 20 MG tablet 3 tabs po day one, then 2 po daily x 4 days 10/17/16   Malvin Johns, MD    Family History Family History  Problem Relation Age of Onset  . Hypertension Mother   . Other Mother        hyperaldosteronism and padgett's disease  . Asthma Mother   . Heart disease Maternal Aunt   . Diabetes Maternal Aunt   . Diabetes Maternal Uncle   . Diabetes Cousin     Social History Social History  Substance Use Topics  . Smoking status: Former Smoker    Packs/day: 0.50    Types: Cigarettes    Quit date: 11/25/2015  . Smokeless tobacco: Former Systems developer  . Alcohol use 0.0 oz/week     Comment: occ     Allergies   Dilaudid [hydromorphone hcl]   Review of Systems Review of Systems  Constitutional: Positive for fatigue. Negative for chills,  diaphoresis and fever.  HENT: Positive for congestion and rhinorrhea. Negative for sneezing.   Eyes: Negative.   Respiratory: Positive for cough. Negative for chest tightness and shortness of breath.   Cardiovascular: Positive for chest pain. Negative for leg swelling.  Gastrointestinal: Positive for vomiting (Posttussive). Negative for abdominal pain, blood in stool, diarrhea and nausea.  Genitourinary: Negative for difficulty urinating, flank pain, frequency and hematuria.  Musculoskeletal: Negative for arthralgias and back pain.  Skin: Negative for rash.  Neurological: Negative for dizziness, speech difficulty, weakness, numbness and headaches.      Physical Exam Updated Vital Signs BP 118/74 (BP Location: Right Arm)   Pulse 88   Temp 98.4 F (36.9 C) (Oral)   Resp 20   SpO2 98%   Physical Exam  Constitutional: He is oriented to person, place, and time. He appears well-developed and well-nourished.  HENT:  Head: Normocephalic and atraumatic.  Right Ear: External ear normal.  Left Ear: External ear normal.  Mouth/Throat: Oropharynx is clear and moist.  Eyes: Pupils are equal, round, and reactive to light.  Neck: Normal range of motion. Neck supple.  Cardiovascular: Normal rate, regular rhythm and normal heart sounds.   Pulmonary/Chest: Effort normal. No respiratory distress. He has wheezes (Slight wheezing bilaterally). He has no rales. He exhibits no tenderness.  Abdominal: Soft. Bowel sounds are normal. There is no tenderness. There is no rebound and no guarding.  Musculoskeletal: Normal range of motion. He exhibits no edema.  No edema or calf tenderness  Lymphadenopathy:    He has no cervical adenopathy.  Neurological: He is alert and oriented to person, place, and time.  Skin: Skin is warm and dry. No rash noted.  Psychiatric: He has a normal mood and affect.     ED Treatments / Results  Labs (all labs ordered are listed, but only abnormal results are displayed) Labs Reviewed - No data to display  EKG  EKG Interpretation None       Radiology Dg Chest 2 View  Result Date: 10/17/2016 CLINICAL DATA:  One month illness EXAM: CHEST  2 VIEW COMPARISON:  11/13/2013 FINDINGS: Normal heart size. Lungs clear. No pneumothorax. No pleural effusion. IMPRESSION: No active cardiopulmonary disease. Electronically Signed   By: Marybelle Killings M.D.   On: 10/17/2016 07:42    Procedures Procedures (including critical care time)  Medications Ordered in ED Medications  albuterol (PROVENTIL HFA;VENTOLIN HFA) 108 (90 Base) MCG/ACT inhaler 2 puff (not administered)  ipratropium-albuterol (DUONEB) 0.5-2.5 (3) MG/3ML  nebulizer solution 3 mL (3 mLs Nebulization Given 10/17/16 0724)     Initial Impression / Assessment and Plan / ED Course  I have reviewed the triage vital signs and the nursing notes.  Pertinent labs & imaging results that were available during my care of the patient were reviewed by me and considered in my medical decision making (see chart for details).     Patient is a 32 year old male who presents with cough and cold symptoms. His chest x-ray is negative for pneumonia. He has some slight wheezing on exam but no increased work of breathing. No hypoxia. He was dispensed an albuterol MDI in the ED. I will start him on prednisone and Hycodan cough syrup. He's otherwise well-appearing. I suspect this is a viral infection with some reactive airway disease. He was discharged home in good condition. He was advised to follow-up with his PCP if his symptoms are not improving. He was advised return here if he has any worsening symptoms.  Final Clinical  Impressions(s) / ED Diagnoses   Final diagnoses:  Viral URI with cough    New Prescriptions New Prescriptions   HYDROCODONE-HOMATROPINE (HYCODAN) 5-1.5 MG/5ML SYRUP    Take 5 mLs by mouth every 6 (six) hours as needed for cough.   PREDNISONE (DELTASONE) 20 MG TABLET    3 tabs po day one, then 2 po daily x 4 days     Malvin Johns, MD 10/17/16 0800

## 2016-11-08 ENCOUNTER — Emergency Department (HOSPITAL_BASED_OUTPATIENT_CLINIC_OR_DEPARTMENT_OTHER)
Admission: EM | Admit: 2016-11-08 | Discharge: 2016-11-08 | Disposition: A | Discharge status: Home / Self Care | Payer: BLUE CROSS/BLUE SHIELD | Attending: Emergency Medicine | Admitting: Emergency Medicine

## 2016-11-08 ENCOUNTER — Encounter (HOSPITAL_BASED_OUTPATIENT_CLINIC_OR_DEPARTMENT_OTHER): Payer: Self-pay

## 2016-11-08 ENCOUNTER — Emergency Department (HOSPITAL_BASED_OUTPATIENT_CLINIC_OR_DEPARTMENT_OTHER): Payer: BLUE CROSS/BLUE SHIELD

## 2016-11-08 DIAGNOSIS — Z79899 Other long term (current) drug therapy: Secondary | ICD-10-CM | POA: Diagnosis not present

## 2016-11-08 DIAGNOSIS — S93492A Sprain of other ligament of left ankle, initial encounter: Secondary | ICD-10-CM

## 2016-11-08 DIAGNOSIS — S8262XA Displaced fracture of lateral malleolus of left fibula, initial encounter for closed fracture: Secondary | ICD-10-CM | POA: Insufficient documentation

## 2016-11-08 DIAGNOSIS — Y929 Unspecified place or not applicable: Secondary | ICD-10-CM | POA: Insufficient documentation

## 2016-11-08 DIAGNOSIS — S99912A Unspecified injury of left ankle, initial encounter: Secondary | ICD-10-CM | POA: Diagnosis present

## 2016-11-08 DIAGNOSIS — S82892A Other fracture of left lower leg, initial encounter for closed fracture: Secondary | ICD-10-CM

## 2016-11-08 DIAGNOSIS — Y998 Other external cause status: Secondary | ICD-10-CM | POA: Insufficient documentation

## 2016-11-08 DIAGNOSIS — Z9049 Acquired absence of other specified parts of digestive tract: Secondary | ICD-10-CM | POA: Diagnosis not present

## 2016-11-08 DIAGNOSIS — Z87891 Personal history of nicotine dependence: Secondary | ICD-10-CM | POA: Insufficient documentation

## 2016-11-08 DIAGNOSIS — W109XXA Fall (on) (from) unspecified stairs and steps, initial encounter: Secondary | ICD-10-CM | POA: Insufficient documentation

## 2016-11-08 DIAGNOSIS — Y9389 Activity, other specified: Secondary | ICD-10-CM | POA: Insufficient documentation

## 2016-11-08 MED ORDER — HYDROCODONE-ACETAMINOPHEN 5-325 MG PO TABS
1.0000 | ORAL_TABLET | Freq: Once | ORAL | Status: AC
  Administered 2016-11-08: 1 via ORAL
  Filled 2016-11-08: qty 1

## 2016-11-08 MED ORDER — IBUPROFEN 600 MG PO TABS: 600.0000 mg | ORAL_TABLET | Freq: Four times a day (QID) | ORAL | 0 refills | Status: DC | PRN

## 2016-11-08 NOTE — Discharge Instructions (Signed)
You were seen today for an ankle injury.  You have a sprain of the left ankle with a small avulsion fracture.  You may ambulate as tolerated with the boot.  Rest, ice, compression, elevation recommended.  Ibuprofen as needed for pain.  Sports medicine follow-up provided above if needed.

## 2016-11-08 NOTE — ED Notes (Signed)
Pt ambulated to room with a limp

## 2016-11-08 NOTE — ED Notes (Signed)
Pt had ibuprofen prior to arrival. Pt declined ice pack when offered, states "ive had so much ice on it"

## 2016-11-08 NOTE — ED Notes (Signed)
Patient transported to X-ray 

## 2016-11-08 NOTE — ED Triage Notes (Signed)
Pt reports twisting his left ankle and feeling a pop, swelling noted.

## 2016-11-08 NOTE — ED Provider Notes (Signed)
[facility] Provider Note   CSN: [id number] Arrival date & time: 11/08/16  0441     History   Chief Complaint Chief Complaint  Patient presents with  . Ankle Pain    HPI Blake Baldwin is a 31 y.o. male.  HPI  This is a 31 year old male who presents with left ankle pain.  Patient reports that he was walking down the steps at 3 AM when he rolled his left ankle.  He denies other injury, hitting his head, loss of consciousness.  He has been icing it and took ibuprofen.  He states that when he walks on it he has pain but when he is not walking on it his pain is 0.  Denies numbness or tingling in the foot.  Denies knee pain.  He has been ambulatory.  Past Medical History:  Diagnosis Date  . Bronchitis   . Gallstones     Patient Active Problem List   Diagnosis Date Noted  . Cyst of skin 04/10/2015  . Right shoulder strain 06/18/2014  . Wrist pain 05/30/2014  . Folliculitis 52/84/1324  . Wart 02/26/2014  . Hemoptysis 11/13/2013  . Acute upper respiratory infection 11/04/2013  . Insect bite 09/18/2013  . Weight gain 09/18/2013  . Musculoskeletal neck pain 06/07/2013  . Shoulder mass 05/08/2013  . GERD (gastroesophageal reflux disease) 06/22/2012  . Routine general medical examination at a health care facility 01/18/2012  . Nevus of eye 01/18/2012  . Thrombocytopenia ([place]) 01/18/2012  . Tobacco abuse 01/18/2012    Past Surgical History:  Procedure Laterality Date  . APPENDECTOMY    . CHOLECYSTECTOMY         Home Medications    Prior to Admission medications   Medication Sig Start Date End Date Taking? Authorizing Provider  cholestyramine (QUESTRAN) 4 g packet Take 1 packet (4 g total) by mouth 3 (three) times daily with meals. 05/25/16  Yes Dowless, Samantha Tripp, PA-C  HYDROcodone-homatropine (HYCODAN) 5-1.5 MG/5ML syrup Take 5 mLs by mouth every 6 (six) hours as needed for cough. 10/17/16  Yes Malvin Johns, MD  Menthol-Zinc  Oxide (CALMOSEPTINE) 0.44-20.6 % OINT Apply 1 application topically as needed. 05/25/16  Yes Dowless, Dondra Spry, PA-C  omeprazole (PRILOSEC OTC) 20 MG tablet Take 1 tablet (20 mg total) by mouth daily. 06/22/12  Yes [name], NP  predniSONE (DELTASONE) 20 MG tablet 3 tabs po day one, then 2 po daily x 4 days 10/17/16  Yes Malvin Johns, MD  ibuprofen (ADVIL,MOTRIN) 600 MG tablet Take 1 tablet (600 mg total) by mouth every 6 (six) hours as needed. 11/08/16   Eloni Darius, Barbette Hair, MD    Family History Family History  Problem Relation Age of Onset  . Hypertension Mother   . Other Mother        hyperaldosteronism and padgett's disease  . Asthma Mother   . Heart disease Maternal Aunt   . Diabetes Maternal Aunt   . Diabetes Maternal Uncle   . Diabetes Cousin     Social History Social History  Substance Use Topics  . Smoking status: Former Smoker    Packs/day: 0.50    Types: Cigarettes    Quit date: 11/25/2015  . Smokeless tobacco: Former Systems developer  . Alcohol use 0.0 oz/week     Comment: occ     Allergies   Dilaudid [hydromorphone hcl]   Review of Systems Review of Systems  Musculoskeletal:       Left ankle pain  Skin: Negative for  wound.  All other systems reviewed and are negative.    Physical Exam Updated Vital Signs BP (!) 148/91 (BP Location: Right Arm)   Pulse 95   Temp 97.9 F (36.6 C) (Oral)   Resp 18   Ht 6' (1.829 m)   SpO2 99%   Physical Exam  Constitutional: He is oriented to person, place, and time. He appears well-developed and well-nourished. No distress.  HENT:  Head: Normocephalic and atraumatic.  Cardiovascular: Normal rate and regular rhythm.   Pulmonary/Chest: Effort normal. No respiratory distress.  Musculoskeletal: He exhibits no edema.  Tenderness and swelling noted over the lateral maleate plus DP pulse, no midfoot tenderness, neurovascular intact distally, no proximal fibular tenderness.  Neurological: He is alert and oriented to  person, place, and time.  Skin: Skin is warm and dry.  Psychiatric: He has a normal mood and affect.  Nursing note and vitals reviewed.    ED Treatments / Results  Labs (all labs ordered are listed, but only abnormal results are displayed) Labs Reviewed - No data to display  EKG  EKG Interpretation None       Radiology Dg Ankle Complete Left  Result Date: 11/08/2016 CLINICAL DATA:  Twisted ankle 2 hours ago. Predominately lateral ankle pain and swelling. EXAM: LEFT ANKLE COMPLETE - 3+ VIEW COMPARISON:  None. FINDINGS: Tiny avulsion fracture fragment lateral malleolus tip. No dislocation. The ankle mortise appears congruent and the tibiofibular syndesmosis intact. No destructive bony lesions. Lateral ankle soft tissue swelling without subcutaneous gas or radiopaque foreign bodies. IMPRESSION: Acute tiny lateral malleolus avulsion fracture.  No dislocation. Electronically Signed   By: Elon Alas M.D.   On: 11/08/2016 05:21    Procedures Procedures (including critical care time)  Medications Ordered in ED Medications  HYDROcodone-acetaminophen (NORCO/VICODIN) 5-325 MG per tablet 1 tablet (1 tablet Oral Given 11/08/16 0520)     Initial Impression / Assessment and Plan / ED Course  I have reviewed the triage vital signs and the nursing notes.  Pertinent labs & imaging results that were available during my care of the patient were reviewed by me and considered in my medical decision making (see chart for details).     Patient presents with left ankle injury.  X-rays show a tiny amount lateral malleolus avulsion fracture.  Likely associated with a sprain.  Will place in a Cam walker boot.  Rest, ice, compression, elevation recommended.  After history, exam, and medical workup I feel the patient has been appropriately medically screened and is safe for discharge home. Pertinent diagnoses were discussed with the patient. Patient was given return precautions.   Final  Clinical Impressions(s) / ED Diagnoses   Final diagnoses:  Sprain of other ligament of left ankle, initial encounter  Avulsion fracture of ankle, left, closed, initial encounter    New Prescriptions New Prescriptions   IBUPROFEN (ADVIL,MOTRIN) 600 MG TABLET    Take 1 tablet (600 mg total) by mouth every 6 (six) hours as needed.     Merryl Hacker, MD 11/08/16 [phone]

## 2016-11-08 NOTE — ED Notes (Signed)
ED Provider at bedside. 

## 2017-03-23 ENCOUNTER — Emergency Department (HOSPITAL_BASED_OUTPATIENT_CLINIC_OR_DEPARTMENT_OTHER)
Admission: EM | Admit: 2017-03-23 | Discharge: 2017-03-23 | Disposition: A | Discharge status: Home / Self Care | Payer: Managed Care, Other (non HMO) | Attending: Emergency Medicine | Admitting: Emergency Medicine

## 2017-03-23 ENCOUNTER — Encounter (HOSPITAL_BASED_OUTPATIENT_CLINIC_OR_DEPARTMENT_OTHER): Payer: Self-pay | Admitting: Emergency Medicine

## 2017-03-23 ENCOUNTER — Other Ambulatory Visit: Payer: Self-pay

## 2017-03-23 DIAGNOSIS — Z79899 Other long term (current) drug therapy: Secondary | ICD-10-CM | POA: Diagnosis not present

## 2017-03-23 DIAGNOSIS — M791 Myalgia, unspecified site: Secondary | ICD-10-CM | POA: Insufficient documentation

## 2017-03-23 DIAGNOSIS — R1110 Vomiting, unspecified: Secondary | ICD-10-CM | POA: Diagnosis not present

## 2017-03-23 DIAGNOSIS — Z87891 Personal history of nicotine dependence: Secondary | ICD-10-CM | POA: Insufficient documentation

## 2017-03-23 DIAGNOSIS — R197 Diarrhea, unspecified: Secondary | ICD-10-CM | POA: Diagnosis present

## 2017-03-23 DIAGNOSIS — K529 Noninfective gastroenteritis and colitis, unspecified: Secondary | ICD-10-CM | POA: Insufficient documentation

## 2017-03-23 MED ORDER — GI COCKTAIL ~~LOC~~
30.0000 mL | Freq: Once | ORAL | Status: AC
  Administered 2017-03-23: 30 mL via ORAL
  Filled 2017-03-23: qty 30

## 2017-03-23 MED ORDER — DICYCLOMINE HCL 10 MG/ML IM SOLN
20.0000 mg | Freq: Once | INTRAMUSCULAR | Status: AC
  Administered 2017-03-23: 20 mg via INTRAMUSCULAR
  Filled 2017-03-23: qty 2

## 2017-03-23 MED ORDER — ONDANSETRON 8 MG PO TBDP: ORAL_TABLET | ORAL | 0 refills | Status: DC

## 2017-03-23 MED ORDER — ONDANSETRON 8 MG PO TBDP
8.0000 mg | ORAL_TABLET | Freq: Once | ORAL | Status: AC
  Administered 2017-03-23: 8 mg via ORAL
  Filled 2017-03-23: qty 1

## 2017-03-23 NOTE — ED Triage Notes (Signed)
Diarrhea and n/v since 2000 tonight . Chills and sweating with H/A. Marland Kitchen

## 2017-03-23 NOTE — ED Notes (Signed)
Given gingerale for fluid challenge  

## 2017-03-23 NOTE — ED Provider Notes (Signed)
[facility] EM[facility] DEPARTMENT Provider Note   CSN: [id number] Arrival date & time: 03/23/17  0257     History   Chief Complaint Chief Complaint  Patient presents with  . Diarrhea    HPI Blake Baldwin is a 32 y.o. male.  The history is provided by the patient.  Diarrhea   This is a new problem. The current episode started 3 to 5 hours ago. The problem occurs 2 to 4 times per day. The problem has not changed since onset.The stool consistency is described as watery. There has been no fever. Associated symptoms include vomiting and myalgias. Pertinent negatives include no abdominal pain. The treatment provided no relief. Risk factors include ill contacts (friends kids with same). His past medical history does not include irritable bowel syndrome.  Emesis   This is a new problem. The current episode started 3 to 5 hours ago. The problem occurs 2 to 4 times per day. The problem has not changed since onset.The emesis has an appearance of stomach contents. There has been no fever. Associated symptoms include diarrhea and myalgias. Pertinent negatives include no abdominal pain and no fever. Risk factors include ill contacts.    Past Medical History:  Diagnosis Date  . Bronchitis   . Gallstones     Patient Active Problem List   Diagnosis Date Noted  . Cyst of skin 04/10/2015  . Right shoulder strain 06/18/2014  . Wrist pain 05/30/2014  . Folliculitis 29/92/4268  . Wart 02/26/2014  . Hemoptysis 11/13/2013  . Acute upper respiratory infection 11/04/2013  . Insect bite 09/18/2013  . Weight gain 09/18/2013  . Musculoskeletal neck pain 06/07/2013  . Shoulder mass 05/08/2013  . GERD (gastroesophageal reflux disease) 06/22/2012  . Routine general medical examination at a health care facility 01/18/2012  . Nevus of eye 01/18/2012  . Thrombocytopenia ([place]) 01/18/2012  . Tobacco abuse 01/18/2012    Past Surgical History:  Procedure Laterality Date  . APPENDECTOMY      . CHOLECYSTECTOMY         Home Medications    Prior to Admission medications   Medication Sig Start Date End Date Taking? Authorizing Provider  cholestyramine (QUESTRAN) 4 g packet Take 1 packet (4 g total) by mouth 3 (three) times daily with meals. 05/25/16   Dowless, Samantha Tripp, PA-C  HYDROcodone-homatropine (HYCODAN) 5-1.5 MG/5ML syrup Take 5 mLs by mouth every 6 (six) hours as needed for cough. 10/17/16   Malvin Johns, MD  ibuprofen (ADVIL,MOTRIN) 600 MG tablet Take 1 tablet (600 mg total) by mouth every 6 (six) hours as needed. 11/08/16   Horton, Barbette Hair, MD  Menthol-Zinc Oxide (CALMOSEPTINE) 0.44-20.6 % OINT Apply 1 application topically as needed. 05/25/16   Dowless, Aldona Bar Tripp, PA-C  omeprazole (PRILOSEC OTC) 20 MG tablet Take 1 tablet (20 mg total) by mouth daily. 06/22/12   [name], NP  predniSONE (DELTASONE) 20 MG tablet 3 tabs po day one, then 2 po daily x 4 days 10/17/16   Malvin Johns, MD    Family History Family History  Problem Relation Age of Onset  . Hypertension Mother   . Other Mother        hyperaldosteronism and padgett's disease  . Asthma Mother   . Heart disease Maternal Aunt   . Diabetes Maternal Aunt   . Diabetes Maternal Uncle   . Diabetes Cousin     Social History Social History   Tobacco Use  . Smoking status: Former Smoker    Packs/day:  0.50    Types: Cigarettes    Last attempt to quit: 11/25/2015    Years since quitting: 1.3  . Smokeless tobacco: Former Network engineer Use Topics  . Alcohol use: Yes    Alcohol/week: 0.0 oz    Comment: occ  . Drug use: No     Allergies   Dilaudid [hydromorphone hcl]   Review of Systems Review of Systems  Constitutional: Negative for diaphoresis and fever.  HENT: Negative for ear pain, trouble swallowing and voice change.   Respiratory: Negative for shortness of breath.   Cardiovascular: Negative for chest pain and leg swelling.  Gastrointestinal: Positive for diarrhea and  vomiting. Negative for abdominal pain and anal bleeding.  Genitourinary: Negative for dysuria and flank pain.  Musculoskeletal: Positive for myalgias. Negative for back pain.  All other systems reviewed and are negative.    Physical Exam Updated Vital Signs BP 131/81 (BP Location: Right Arm)   Pulse 84   Temp 98.4 F (36.9 C) (Oral)   Resp (!) 22   Ht 6\' 1"  (1.854 m)   Wt 86.2 kg (190 lb)   SpO2 100%   BMI 25.07 kg/m   Physical Exam  Constitutional: He appears well-developed and well-nourished. No distress.  HENT:  Head: Normocephalic and atraumatic.  Mouth/Throat: Oropharynx is clear and moist. No oropharyngeal exudate.  Eyes: Conjunctivae are normal. Pupils are equal, round, and reactive to light.  Neck: Normal range of motion. Neck supple.  Cardiovascular: Normal rate, regular rhythm, normal heart sounds and intact distal pulses.  Pulmonary/Chest: Effort normal and breath sounds normal. No stridor. He has no wheezes. He has no rales.  Abdominal: Soft. Bowel sounds are normal. He exhibits no mass. There is no tenderness. There is no rebound and no guarding. No hernia.  Musculoskeletal: Normal range of motion.  Neurological: He is alert.  Skin: Skin is warm and dry. Capillary refill takes less than 2 seconds.  Psychiatric: He has a normal mood and affect.     ED Treatments / Results   Vitals:   03/23/17 0306  BP: 131/81  Pulse: 84  Resp: (!) 22  Temp: 98.4 F (36.9 C)  SpO2: 100%    Procedures Procedures (including critical care time)  Medications Ordered in ED Medications  ondansetron (ZOFRAN-ODT) disintegrating tablet 8 mg (not administered)  dicyclomine (BENTYL) injection 20 mg (not administered)  gi cocktail (Maalox,Lidocaine,Donnatal) (not administered)     PO challenged successfully in the ED.  Symptoms are clearly viral there is no indication for labs or imaging at this time.    Final Clinical Impressions(s) / ED Diagnoses   Return for weakness,  numbness, changes in vision or speech, fevers >100.4 unrelieved by medication, shortness of breath, intractable vomiting, or diarrhea, abdominal pain, Inability to tolerate liquids or food, cough, altered mental status or any concerns. No signs of systemic illness or infection. The patient is nontoxic-appearing on exam and vital signs are within normal limits.   I have reviewed the triage vital signs and the nursing notes. Pertinent labs &imaging results that were available during my care of the patient were reviewed by me and considered in my medical decision making (see chart for details).  After history, exam, and medical workup I feel the patient has been appropriately medically screened and is safe for discharge home. Pertinent diagnoses were discussed with the patient. Patient was given return precautions.    Kurstyn Larios, MD 03/23/17 [phone]

## 2017-06-01 ENCOUNTER — Encounter: Payer: Self-pay | Admitting: Medical

## 2017-06-01 ENCOUNTER — Ambulatory Visit (INDEPENDENT_AMBULATORY_CARE_PROVIDER_SITE_OTHER): Payer: Managed Care, Other (non HMO) | Admitting: Medical

## 2017-06-01 VITALS — BP 140/82 | HR 69 | Temp 98.0°F | Resp 16 | Ht 72.0 in | Wt 192.2 lb

## 2017-06-01 DIAGNOSIS — M25531 Pain in right wrist: Secondary | ICD-10-CM | POA: Diagnosis not present

## 2017-06-01 MED ORDER — DICLOFENAC SODIUM 75 MG PO TBEC: 75.0000 mg | DELAYED_RELEASE_TABLET | Freq: Two times a day (BID) | ORAL | 0 refills | Status: DC

## 2017-06-01 MED FILL — DICLOFENAC SOD EC 75 MG TAB: 75 | 10 days supply | Qty: 20 | Fill #0

## 2017-06-01 NOTE — Progress Notes (Signed)
   Subjective:    Patient ID: Blake Baldwin, male    DOB: 12-08-1985, 32 y.o.   MRN: [id number]  HPI  He updates me working very hard and preparing for his marriage coming up.  Pt in for evaluation for rt wrist very low level  pain and weakness(symptoms for 3 weeks).  He states/describes at times he feels like pain is worse at distal aspect of his ulna. No described injury or fall.  He does work a lot. 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 Often times maybe 70 lb items.  Pt is right handed. He states he is trying not to use it.  He states 4-5 years ago had tendinitis in area and he use splint. He got splint out again and using it for 2 weeks.   Pt had xray in 2016 and studies were negative.    Review of Systems See hpi.    Objective:   Physical Exam  General- No acute distress. Pleasant patient. Neck- Full range of motion, no jvd Lungs- Clear, even and unlabored. Heart- regular rate and rhythm. Neurologic- CNII- XII grossly intact.  Rt hand and wrist- tender to palpation just above the distal ulna. When deviated wrist/hand downward will produce pain.    Assessment & Plan:  For your rt wrist pain, I do want you to rest Saturday, Sunday and Monday. Use your brace daily. Use diclofenac. I suspect tendinitis again.   Please update me by Tuesday or Wednesday. If pain persists will refer you to sports medicine.  Mackie Pai, PA-C

## 2017-06-01 NOTE — Patient Instructions (Signed)
For your rt wrist pain, I do want you to rest Saturday, Sunday and Monday. Use your brace daily. Use diclofenac. I suspect tendinitis again.   Please update me by Tuesday or Wednesday. If pain persists will refer you to sports medicine.

## 2017-06-06 ENCOUNTER — Telehealth: Payer: Self-pay | Admitting: Family

## 2017-06-06 NOTE — Telephone Encounter (Signed)
[name]-- can you give recommendation since this was prescribed by Percell Miller at an acute OV on 06/01/17?

## 2017-06-06 NOTE — Telephone Encounter (Signed)
Copied from [place] [id number]. Topic: Quick Communication - See Telephone Encounter >> Jun 06, 2017  5:12 PM Percell Belt A wrote: CRM for notification. See Telephone encounter for: 06/06/17. Pt called in and stated that when he took the diclofenac (VOLTAREN) 75 MG EC tablet [[id number]].  He said that it made me pace and feel a lot of anxiety.  He stated he would like to know if there is something else he could try instead of this med.  Please advise   Best number  [phone]

## 2017-06-07 NOTE — Telephone Encounter (Signed)
Can he tolerate ibuprofen?  If so, he can try ibuprofen. If not, I recommend tylenol.

## 2017-06-07 NOTE — Telephone Encounter (Signed)
Notified pt and he voices understanding. 

## 2017-06-07 NOTE — Telephone Encounter (Signed)
Notified pt. He states that PA was treating him for possible tendonitis and wanted to know if below recommendation will work for that?

## 2017-06-07 NOTE — Telephone Encounter (Signed)
Yes, it should help that.

## 2017-07-10 ENCOUNTER — Encounter: Payer: Self-pay | Admitting: Family

## 2017-07-10 ENCOUNTER — Ambulatory Visit (INDEPENDENT_AMBULATORY_CARE_PROVIDER_SITE_OTHER): Payer: Managed Care, Other (non HMO) | Admitting: Family

## 2017-07-10 VITALS — BP 131/84 | HR 79 | Temp 98.2°F | Resp 16 | Ht 72.0 in | Wt 194.0 lb

## 2017-07-10 DIAGNOSIS — K219 Gastro-esophageal reflux disease without esophagitis: Secondary | ICD-10-CM

## 2017-07-10 DIAGNOSIS — M79671 Pain in right foot: Secondary | ICD-10-CM | POA: Diagnosis not present

## 2017-07-10 DIAGNOSIS — M25531 Pain in right wrist: Secondary | ICD-10-CM

## 2017-07-10 NOTE — Progress Notes (Signed)
Subjective:    Patient ID: Blake Baldwin, male    DOB: 1986/01/01, 32 y.o.   MRN: [id number]  HPI  Patient is a 32 yr old male who presents today with c/o right wrist pain. Symptoms began about 5 weeks ago. He saw PA Saguire for the same on 06/01/17.  Was recommended to try wrist braces and diclofenac. 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  Reports that he took one dose of diclofenac but he felt "ancy" so she stopped it. He was taking ibuprofen and switched to tylenol.   GERD- reports that he started this about a month ago.  Certain foods irritate his gerd.   R heel pain- reports + pain in right heel. Was wearing his Vans to work but now switch to lasix.     Review of Systems See HPI  Past Medical History:  Diagnosis Date  . Bronchitis   . Gallstones      Social History   Socioeconomic History  . Marital status: Divorced    Spouse name: Not on file  . Number of children: Not on file  . Years of education: Not on file  . Highest education level: Not on file  Occupational History  . Not on file  Social Needs  . Financial resource strain: Not on file  . Food insecurity:    Worry: Not on file    Inability: Not on file  . Transportation needs:    Medical: Not on file    Non-medical: Not on file  Tobacco Use  . Smoking status: Former Smoker    Packs/day: 0.50    Types: Cigarettes    Last attempt to quit: 11/25/2015    Years since quitting: 1.6  . Smokeless tobacco: Former Network engineer and Sexual Activity  . Alcohol use: Yes    Alcohol/week: 0.0 oz    Comment: occ  . Drug use: No  . Sexual activity: Not Currently  Lifestyle  . Physical activity:    Days per week: Not on file    Minutes per session: Not on file  . Stress: Not on file  Relationships  . Social connections:    Talks on phone: Not on file    Gets together: Not on file    Attends religious service: Not on file    Active member  of club or organization: Not on file    Attends meetings of clubs or organizations: Not on file    Relationship status: Not on file  . Intimate partner violence:    Fear of current or ex partner: Not on file    Emotionally abused: Not on file    Physically abused: Not on file    Forced sexual activity: Not on file  Other Topics Concern  . Not on file  Social History Narrative   In middle of divorce   Has 2 children   Works at [employer]    Past Surgical History:  Procedure Laterality Date  . APPENDECTOMY    . CHOLECYSTECTOMY      Family History  Problem Relation Age of Onset  . Hypertension Mother   . Other Mother        hyperaldosteronism and padgett's disease  . Asthma Mother   . Heart disease Maternal Aunt   . Diabetes Maternal Aunt   . Diabetes Maternal Uncle   . Diabetes Cousin     Allergies  Allergen Reactions  .  Diclofenac     anxiety  . Dilaudid [Hydromorphone Hcl] Itching and Rash    Current Outpatient Medications on File Prior to Visit  Medication Sig Dispense Refill  . omeprazole (PRILOSEC) 20 MG capsule Take 20 mg by mouth daily.     No current facility-administered medications on file prior to visit.     BP 131/84 (BP Location: Right Arm, Patient Position: Sitting, Cuff Size: Small)   Pulse 79   Temp 98.2 F (36.8 C) (Oral)   Resp 16   Ht 6' (1.829 m)   Wt 194 lb (88 kg)   SpO2 99%   BMI 26.31 kg/m       Objective:   Physical Exam  Constitutional: He is oriented to person, place, and time. He appears well-developed and well-nourished. No distress.  HENT:  Head: Normocephalic and atraumatic.  Cardiovascular: Normal rate and regular rhythm.  No murmur heard. Pulmonary/Chest: Effort normal and breath sounds normal. No respiratory distress. He has no wheezes. He has no rales.  Musculoskeletal: He exhibits no edema.  Right heel without swelling/lesion or tenderness.  R wrist without swelling or tenderness.  + pain with wrist adduction    Neurological: He is alert and oriented to person, place, and time.  Skin: Skin is warm and dry.  Psychiatric: He has a normal mood and affect. His behavior is normal. Thought content normal.          Assessment & Plan:  R wrist pain- refer to sports medicine  R heel pain- suspect plantar fasciitis. Advised pt as follows:   Wear your Asics to work. Ice your foot twice daily. Do exercises for your heel pain. (given patient handout from up to date on plantar fasciitis).  Continue ibuprofen as needed for wrist/heel pain. Let me know if your heel pain is not improved in a few weeks and we will get you in to see the foot doctor.   GERD- continue otc omeprazole, discussed reflux diet and handout provided.   GERD- improved

## 2017-07-10 NOTE — Patient Instructions (Addendum)
You will be contacted about your referral to the sports medicine. Work on reflux diet.  Wear your Asics to work. Ice your foot twice daily. Do exercises for your heel pain.  Continue ibuprofen as needed for wrist/heel pain. Let me know if your heel pain is not improved in a few weeks and we will get you in to see the foot doctor.  Good luck with your wedding!   Food Choices for Gastroesophageal Reflux Disease, Adult When you have gastroesophageal reflux disease (GERD), the foods you eat and your eating habits are very important. Choosing the right foods can help ease your discomfort. What guidelines do I need to follow?  Choose fruits, vegetables, whole grains, and low-fat dairy products.  Choose low-fat meat, fish, and poultry.  Limit fats such as oils, salad dressings, butter, nuts, and avocado.  Keep a food diary. This helps you identify foods that cause symptoms.  Avoid foods that cause symptoms. These may be different for everyone.  Eat small meals often instead of 3 large meals a day.  Eat your meals slowly, in a place where you are relaxed.  Limit fried foods.  Cook foods using methods other than frying.  Avoid drinking alcohol.  Avoid drinking large amounts of liquids with your meals.  Avoid bending over or lying down until 2-3 hours after eating. What foods are not recommended? These are some foods and drinks that may make your symptoms worse: Vegetables Tomatoes. Tomato juice. Tomato and spaghetti sauce. Chili peppers. Onion and garlic. Horseradish. Fruits Oranges, grapefruit, and lemon (fruit and juice). Meats High-fat meats, fish, and poultry. This includes hot dogs, ribs, ham, sausage, salami, and bacon. Dairy Whole milk and chocolate milk. Sour cream. Cream. Butter. Ice cream. Cream cheese. Drinks Coffee and tea. Bubbly (carbonated) drinks or energy drinks. Condiments Hot sauce. Barbecue sauce. Sweets/Desserts Chocolate and cocoa. Donuts. Peppermint  and spearmint. Fats and Oils High-fat foods. This includes [place] fries and potato chips. Other Vinegar. Strong spices. This includes black pepper, white pepper, red pepper, cayenne, curry powder, cloves, ginger, and chili powder. The items listed above may not be a complete list of foods and drinks to avoid. Contact your dietitian for more information. This information is not intended to replace advice given to you by your health care provider. Make sure you discuss any questions you have with your health care provider. Document Released: 06/28/2011 Document Revised: 06/04/2015 Document Reviewed: 10/31/2012 Elsevier Interactive Patient Education  2017 [employer].

## 2017-07-20 ENCOUNTER — Encounter: Payer: Self-pay | Admitting: Family Medicine

## 2017-07-20 ENCOUNTER — Ambulatory Visit (INDEPENDENT_AMBULATORY_CARE_PROVIDER_SITE_OTHER): Payer: Managed Care, Other (non HMO) | Admitting: Family Medicine

## 2017-07-20 DIAGNOSIS — M25531 Pain in right wrist: Secondary | ICD-10-CM

## 2017-07-20 NOTE — Patient Instructions (Signed)
You have ulnar tendinitis of your wrist. Ice the area 15 minutes at a time 3-4 times a day if possible. Aleve 2 tabs twice a day with food - take at least 7-10 days then as needed. Continue wearing the wrist brace at work. An ulnar gutter splint is another consideration but is more bulky than this and would definitely limit you at work. Follow up with me as needed (if you want to try that splint let me know - occupational therapy is another consideration).

## 2017-07-23 ENCOUNTER — Encounter: Payer: Self-pay | Admitting: Family Medicine

## 2017-07-23 NOTE — Progress Notes (Signed)
PCP and consultation requested by: Blake Alar, NP  Subjective:   HPI: Patient is a 32 y.o. male here for right wrist pain.  Patient reports he's had about 2 months of ulnar sided wrist pain. He is right handed, works at a job on a Estate manager/land agent. Job involved a lot of pushing, pulling, taking things off the belt. Area gets tender and feels bruised on ulnar side. No catching, locking. Throbbing radiates into right hand. Pain level 3/10, worse at work. No swelling or bruising. No numbness or tingling. Right handed.  Past Medical History:  Diagnosis Date  . Bronchitis   . Gallstones     Current Outpatient Medications on File Prior to Visit  Medication Sig Dispense Refill  . omeprazole (PRILOSEC) 20 MG capsule Take 20 mg by mouth daily.     No current facility-administered medications on file prior to visit.     Past Surgical History:  Procedure Laterality Date  . APPENDECTOMY    . CHOLECYSTECTOMY      Allergies  Allergen Reactions  . Diclofenac     anxiety  . Dilaudid [Hydromorphone Hcl] Itching and Rash    Social History   Socioeconomic History  . Marital status: Divorced    Spouse name: Not on file  . Number of children: Not on file  . Years of education: Not on file  . Highest education level: Not on file  Occupational History  . Not on file  Social Needs  . Financial resource strain: Not on file  . Food insecurity:    Worry: Not on file    Inability: Not on file  . Transportation needs:    Medical: Not on file    Non-medical: Not on file  Tobacco Use  . Smoking status: Former Smoker    Packs/day: 0.50    Types: Cigarettes    Last attempt to quit: 11/25/2015    Years since quitting: 1.6  . Smokeless tobacco: Former Network engineer and Sexual Activity  . Alcohol use: Yes    Alcohol/week: 0.0 oz    Comment: occ  . Drug use: No  . Sexual activity: Not Currently  Lifestyle  . Physical activity:    Days per week: Not on file    Minutes  per session: Not on file  . Stress: Not on file  Relationships  . Social connections:    Talks on phone: Not on file    Gets together: Not on file    Attends religious service: Not on file    Active member of club or organization: Not on file    Attends meetings of clubs or organizations: Not on file    Relationship status: Not on file  . Intimate partner violence:    Fear of current or ex partner: Not on file    Emotionally abused: Not on file    Physically abused: Not on file    Forced sexual activity: Not on file  Other Topics Concern  . Not on file  Social History Narrative   In middle of divorce   Has 2 children   Works at [employer]    Family History  Problem Relation Age of Onset  . Hypertension Mother   . Other Mother        hyperaldosteronism and padgett's disease  . Asthma Mother   . Heart disease Maternal Aunt   . Diabetes Maternal Aunt   . Diabetes Maternal Uncle   . Diabetes Cousin     BP 123/86  Pulse 67   Ht 6' (1.829 m)   Wt 193 lb (87.5 kg)   BMI 26.18 kg/m   Review of Systems: See HPI above.     Objective:  Physical Exam:  Gen: NAD, comfortable in exam room  Right hand/wrist: No deformity, swelling, bruising.  No clicking, catching on wrist ROM. FROM with 5/5 strength.  Pain on resisted ulnar deviation. No tenderness to palpation of TFCC, distal ulna. Negative tinels carpal tunnel. NVI distally.  Left wrist/hand: No deformity. FROM with 5/5 strength. No tenderness to palpation. NVI distally.   Assessment & Plan:  1. Right wrist pain - 2/2 ulnar tendinitis.  No evidence TFCC tear or ulnar abutment syndrome.  Icing, aleve, wrist brace.  Discussed consideration of ulnar gutter splint but this would be very limiting.  Reassured.  Occupational therapy a consideration.  F/u prn.

## 2017-07-23 NOTE — Assessment & Plan Note (Addendum)
2/2 ulnar tendinitis.  No evidence TFCC tear or ulnar abutment syndrome.  Icing, aleve, wrist brace.  Discussed consideration of ulnar gutter splint but this would be very limiting.  Reassured.  Occupational therapy a consideration.  F/u prn.

## 2017-11-08 ENCOUNTER — Encounter: Payer: Self-pay | Admitting: Family

## 2017-11-08 ENCOUNTER — Ambulatory Visit (INDEPENDENT_AMBULATORY_CARE_PROVIDER_SITE_OTHER): Payer: Managed Care, Other (non HMO) | Admitting: Family

## 2017-11-08 VITALS — BP 132/80 | HR 93 | Temp 98.4°F | Resp 16 | Ht 72.0 in | Wt 189.0 lb

## 2017-11-08 DIAGNOSIS — M549 Dorsalgia, unspecified: Secondary | ICD-10-CM | POA: Diagnosis not present

## 2017-11-08 DIAGNOSIS — R05 Cough: Secondary | ICD-10-CM | POA: Diagnosis not present

## 2017-11-08 DIAGNOSIS — R059 Cough, unspecified: Secondary | ICD-10-CM

## 2017-11-08 DIAGNOSIS — R5383 Other fatigue: Secondary | ICD-10-CM

## 2017-11-08 DIAGNOSIS — R110 Nausea: Secondary | ICD-10-CM

## 2017-11-08 LAB — CBC WITH DIFFERENTIAL/PLATELET
BASOS ABS: 0 10*3/uL (ref 0.0–0.1)
Basophils Relative: 0.7 % (ref 0.0–3.0)
EOS ABS: 0.5 10*3/uL (ref 0.0–0.7)
Eosinophils Relative: 8.5 % — ABNORMAL HIGH (ref 0.0–5.0)
HEMATOCRIT: 45.3 % (ref 39.0–52.0)
HEMOGLOBIN: 16 g/dL (ref 13.0–17.0)
LYMPHS PCT: 27.5 % (ref 12.0–46.0)
Lymphs Abs: 1.6 10*3/uL (ref 0.7–4.0)
MCHC: 35.2 g/dL (ref 30.0–36.0)
MCV: 92.5 fl (ref 78.0–100.0)
MONO ABS: 0.4 10*3/uL (ref 0.1–1.0)
Monocytes Relative: 7.2 % (ref 3.0–12.0)
Neutro Abs: 3.2 10*3/uL (ref 1.4–7.7)
Neutrophils Relative %: 56.1 % (ref 43.0–77.0)
Platelets: 164 10*3/uL (ref 150.0–400.0)
RBC: 4.9 Mil/uL (ref 4.22–5.81)
RDW: 12.2 % (ref 11.5–15.5)
WBC: 5.7 10*3/uL (ref 4.0–10.5)

## 2017-11-08 LAB — COMPREHENSIVE METABOLIC PANEL
ALBUMIN: 4.8 g/dL (ref 3.5–5.2)
ALK PHOS: 63 U/L (ref 39–117)
ALT: 16 U/L (ref 0–53)
AST: 11 U/L (ref 0–37)
BUN: 13 mg/dL (ref 6–23)
CALCIUM: 9.8 mg/dL (ref 8.4–10.5)
CHLORIDE: 105 meq/L (ref 96–112)
CO2: 26 mEq/L (ref 19–32)
CREATININE: 0.81 mg/dL (ref 0.40–1.50)
GFR: 117.78 mL/min (ref >60.00)
Glucose, Bld: 89 mg/dL (ref 70–99)
Potassium: 4 mEq/L (ref 3.5–5.1)
Sodium: 140 mEq/L (ref 135–145)
TOTAL PROTEIN: 7.3 g/dL (ref 6.0–8.3)
Total Bilirubin: 0.8 mg/dL (ref 0.2–1.2)

## 2017-11-08 LAB — TSH: TSH: 0.77 u[IU]/mL (ref 0.35–4.50)

## 2017-11-08 MED ORDER — OMEPRAZOLE 40 MG PO CPDR: 40.0000 mg | DELAYED_RELEASE_CAPSULE | Freq: Every day | ORAL | 3 refills | Status: DC

## 2017-11-08 NOTE — Patient Instructions (Addendum)
Take aleve 1 tab twice daily for the next 1 week. Avoid heavy lifting. Call if new/worsening symptoms or if not improved in 2 weeks.  Increase omeprazole to 40mg  once daily. Call if cough worsens, if fever >101 or if not improved in 1 week.

## 2017-11-08 NOTE — Progress Notes (Signed)
Subjective:    Patient ID: Blake Baldwin, male    DOB: [birth year], 32 y.o.   MRN: [id number]  HPI  Patient is a 32 yr old male who presents today with two concerns:   1) Back pain- reports that he developed low back pain on 11/02/17 which began at work. 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  Pain is located in the left low back, has an occasional tingle down the right buttock. Has tried ice packs/biofreeze, ibuprofen.    2) cough- report symptoms started 2 days ago. Step son had a "wet cough."  He reports some nasal drainage.  Denies fever. Mild HA.  Wonders if it is his sinuses with the changing of the weather.    3) Fatigue- reports that he works night shift. Feels stressed and tired but denies any depression symptoms. Reports "I am constantly hungry but never in the mood to eat."  Nausea has been on/off for 1 week. Reports chronic acid reflux symptoms and is s/p cholecystectomy. He continues omeprazole otc  Wt Readings from Last 3 Encounters:  11/08/17 189 lb (85.7 kg)  07/20/17 193 lb (87.5 kg)  07/10/17 194 lb (88 kg)    Review of Systems    see HPI  Past Medical History:  Diagnosis Date  . Bronchitis   . Gallstones      Social History   Socioeconomic History  . Marital status: Divorced    Spouse name: Not on file  . Number of children: Not on file  . Years of education: Not on file  . Highest education level: Not on file  Occupational History  . Not on file  Social Needs  . Financial resource strain: Not on file  . Food insecurity:    Worry: Not on file    Inability: Not on file  . Transportation needs:    Medical: Not on file    Non-medical: Not on file  Tobacco Use  . Smoking status: Former Smoker    Packs/day: 0.50    Types: Cigarettes    Last attempt to quit: 11/25/2015    Years since quitting: 1.9  . Smokeless tobacco: Former Network engineer and Sexual Activity  . Alcohol use: Yes    Alcohol/week: 0.0 standard drinks   Comment: occ  . Drug use: No  . Sexual activity: Not Currently  Lifestyle  . Physical activity:    Days per week: Not on file    Minutes per session: Not on file  . Stress: Not on file  Relationships  . Social connections:    Talks on phone: Not on file    Gets together: Not on file    Attends religious service: Not on file    Active member of club or organization: Not on file    Attends meetings of clubs or organizations: Not on file    Relationship status: Not on file  . Intimate partner violence:    Fear of current or ex partner: Not on file    Emotionally abused: Not on file    Physically abused: Not on file    Forced sexual activity: Not on file  Other Topics Concern  . Not on file  Social History Narrative   In middle of divorce   Has 2 children   Works at [employer]    Past Surgical History:  Procedure Laterality Date  . APPENDECTOMY    . CHOLECYSTECTOMY      Family History  Problem Relation Age of Onset  . Hypertension Mother   . Other Mother        hyperaldosteronism and padgett's disease  . Asthma Mother   . Heart disease Maternal Aunt   . Diabetes Maternal Aunt   . Diabetes Maternal Uncle   . Diabetes Cousin     Allergies  Allergen Reactions  . Diclofenac     anxiety  . Dilaudid [Hydromorphone Hcl] Itching and Rash    Current Outpatient Medications on File Prior to Visit  Medication Sig Dispense Refill  . omeprazole (PRILOSEC) 20 MG capsule Take 20 mg by mouth daily.     No current facility-administered medications on file prior to visit.     BP 132/80 (BP Location: Right Arm, Patient Position: Sitting, Cuff Size: Small)   Pulse 93   Temp 98.4 F (36.9 C) (Oral)   Resp 16   Ht 6' (1.829 m)   Wt 189 lb (85.7 kg)   SpO2 97%   BMI 25.63 kg/m    Objective:   Physical Exam  Constitutional: He is oriented to person, place, and time. He appears well-developed and well-nourished. No distress.  HENT:  Head: Normocephalic and atraumatic.    Cardiovascular: Normal rate and regular rhythm.  No murmur heard. Pulmonary/Chest: Effort normal and breath sounds normal. No respiratory distress. He has no wheezes. He has no rales.  Musculoskeletal: He exhibits no edema.       Cervical back: He exhibits no tenderness.       Thoracic back: He exhibits no tenderness.       Lumbar back: He exhibits no tenderness.  Neurological: He is alert and oriented to person, place, and time.  Skin: Skin is warm and dry.  Psychiatric: He has a normal mood and affect. His behavior is normal. Thought content normal.          Assessment & Plan:  Musculoskeletal back pain- advised aleve 220mg  po bid x  1 week.  Avoid heavy lifting. Call if new/worsening symptoms or if not improved in 2 weeks.   Fatigue- CMET, CBC, TSH unremarkable. Suspect stress is playing a roll. Could consider further discussion about possible underlying depression if symptoms do not improve. He is working on trying to find a less strenuous job.  Cough- likely URI.  Advised pt to  Call if cough worsens, if fever >101 or if not improved in 1 week.   Nausea- LFT WNL, will increase omeprazole form 20mg  to 40mg . Plan GI referral if symptoms fail to improve.

## 2017-12-11 ENCOUNTER — Ambulatory Visit: Payer: Managed Care, Other (non HMO) | Admitting: Family

## 2017-12-11 DIAGNOSIS — Z0289 Encounter for other administrative examinations: Secondary | ICD-10-CM

## 2017-12-12 ENCOUNTER — Encounter: Payer: Self-pay | Admitting: Family

## 2017-12-18 ENCOUNTER — Emergency Department (HOSPITAL_BASED_OUTPATIENT_CLINIC_OR_DEPARTMENT_OTHER)
Admission: EM | Admit: 2017-12-18 | Discharge: 2017-12-19 | Disposition: A | Discharge status: Home / Self Care | Payer: Managed Care, Other (non HMO) | Attending: Emergency Medicine | Admitting: Emergency Medicine

## 2017-12-18 ENCOUNTER — Encounter (HOSPITAL_BASED_OUTPATIENT_CLINIC_OR_DEPARTMENT_OTHER): Payer: Self-pay

## 2017-12-18 ENCOUNTER — Ambulatory Visit: Payer: Self-pay

## 2017-12-18 ENCOUNTER — Other Ambulatory Visit: Payer: Self-pay

## 2017-12-18 DIAGNOSIS — Y92410 Unspecified street and highway as the place of occurrence of the external cause: Secondary | ICD-10-CM | POA: Insufficient documentation

## 2017-12-18 DIAGNOSIS — Y999 Unspecified external cause status: Secondary | ICD-10-CM | POA: Diagnosis not present

## 2017-12-18 DIAGNOSIS — Z87891 Personal history of nicotine dependence: Secondary | ICD-10-CM | POA: Diagnosis not present

## 2017-12-18 DIAGNOSIS — Z23 Encounter for immunization: Secondary | ICD-10-CM | POA: Insufficient documentation

## 2017-12-18 DIAGNOSIS — T07XXXA Unspecified multiple injuries, initial encounter: Secondary | ICD-10-CM | POA: Diagnosis not present

## 2017-12-18 DIAGNOSIS — M542 Cervicalgia: Secondary | ICD-10-CM | POA: Diagnosis present

## 2017-12-18 DIAGNOSIS — Y9389 Activity, other specified: Secondary | ICD-10-CM | POA: Insufficient documentation
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
## 2017-12-18 NOTE — Telephone Encounter (Signed)
Per Lenna Sciara he can try some preparation H and if not better in a week or if it gets worst to call for appointment.  Patient does not answer the phone and no voice mail set up. Will call him in the morning.

## 2017-12-18 NOTE — Telephone Encounter (Signed)
Message from Nils Flack sent at 12/18/2017 4:48 PM EST   Summary: medication recommendation   Pt believes he has hemorrhoids. He is wanting to know if he should get something otc and would like recommendation of what to get that will help him, or if he needs an appt. Please call Tuesday after 10am due to pt having to go to work this evening.

## 2017-12-19 ENCOUNTER — Emergency Department (HOSPITAL_BASED_OUTPATIENT_CLINIC_OR_DEPARTMENT_OTHER): Payer: Managed Care, Other (non HMO)

## 2017-12-19 MED ORDER — TETANUS-DIPHTH-ACELL PERTUSSIS 5-2.5-18.5 LF-MCG/0.5 IM SUSP
0.5000 mL | Freq: Once | INTRAMUSCULAR | Status: AC
  Administered 2017-12-19: 0.5 mL via INTRAMUSCULAR
  Filled 2017-12-19: qty 0.5

## 2017-12-19 MED ORDER — IBUPROFEN 800 MG PO TABS
800.0000 mg | ORAL_TABLET | Freq: Once | ORAL | Status: AC
  Administered 2017-12-19: 800 mg via ORAL
  Filled 2017-12-19: qty 1

## 2017-12-19 MED ORDER — IBUPROFEN 600 MG PO TABS: 600.0000 mg | ORAL_TABLET | Freq: Four times a day (QID) | ORAL | 0 refills | Status: DC | PRN
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
## 2017-12-19 NOTE — ED Notes (Signed)
Pt is in xray

## 2017-12-19 NOTE — Discharge Instructions (Addendum)
Your x-rays are negative.  Take the anti-inflammatories as needed for aches and pains.  Follow-up with your doctor.  Return to the ED if you develop new or worsening symptoms.

## 2017-12-19 NOTE — Telephone Encounter (Signed)
Called patient back regarding hemrrhoids, advised of Preparation H treatment per M. O,sullivan,NP. Patient agreed to try and will call for appointment if no better.

## 2018-08-28 ENCOUNTER — Ambulatory Visit (INDEPENDENT_AMBULATORY_CARE_PROVIDER_SITE_OTHER): Payer: Managed Care, Other (non HMO) | Admitting: Family Medicine

## 2018-08-28 ENCOUNTER — Encounter: Payer: Self-pay | Admitting: Family Medicine

## 2018-08-28 ENCOUNTER — Other Ambulatory Visit: Payer: Self-pay

## 2018-08-28 ENCOUNTER — Encounter: Payer: Self-pay | Admitting: Family

## 2018-08-28 VITALS — BP 110/70 | HR 89 | Temp 98.2°F | Ht 72.0 in | Wt 198.5 lb

## 2018-08-28 DIAGNOSIS — B353 Tinea pedis: Secondary | ICD-10-CM

## 2018-08-28 DIAGNOSIS — L089 Local infection of the skin and subcutaneous tissue, unspecified: Secondary | ICD-10-CM | POA: Diagnosis not present

## 2018-08-28 MED ORDER — DOXYCYCLINE HYCLATE 100 MG PO TABS: 100.0000 mg | ORAL_TABLET | Freq: Two times a day (BID) | ORAL | 0 refills | Status: AC

## 2018-08-28 MED ORDER — KETOCONAZOLE 2 % EX CREA: 1.0000 "application " | TOPICAL_CREAM | Freq: Every day | CUTANEOUS | 0 refills | Status: AC

## 2018-08-28 MED FILL — DOXYCYCLINE HYCLATE 100 MG: 100 | 7 days supply | Qty: 14 | Fill #0

## 2018-08-28 NOTE — Progress Notes (Signed)
CC: Skin issue on foot  Blake Baldwin is a 33 y.o. male here for a skin complaint.  Duration: a few weeks Location: between 4th and 5th toe on R ft Pruritic? Yes Painful? Yes Drainage? Yes New soaps/lotions/topicals/detergents? No Sick contacts? No Other associated symptoms: painful Therapies tried thus far: OTC athlete's foot meds  ROS:  Const: No fevers Skin: As noted in HPI  Past Medical History:  Diagnosis Date  . Bronchitis   . Gallstones     BP 110/70 (BP Location: Left Arm, Patient Position: Sitting, Cuff Size: Normal)   Pulse 89   Temp 98.2 F (36.8 C) (Temporal)   Ht 6' (1.829 m)   Wt 198 lb 8 oz (90 kg)   SpO2 98%   BMI 26.92 kg/m  Gen: awake, alert, appearing stated age Lungs: No accessory muscle use Skin: Macerated skin between 4th and 5th digits on R foot. There is excoriation, a hard nodule and pus that is expressing with pressure, +TTP. No erythema, active drainage, excoriation Psych: Age appropriate judgment and insight  Tinea pedis of right foot - Plan: ketoconazole (NIZORAL) 2 % cream, dri-fit socks, baby powder, try to keep dry; works 12 hr shifts and sweats a lot  Skin infection - Plan: doxycycline (VIBRA-TABS) 100 MG tablet, tx for bacterial infection, I do not think he needs I&D at this time as wound area is open  Orders as above. F/u prn. The patient voiced understanding and agreement to the plan.  [place], DO 08/28/18 10:13 AM

## 2018-08-28 NOTE — Patient Instructions (Addendum)
Try baby powder on your feet. Dri-fit socks can also be helpful.  Let us know if you need anything.

## 2018-08-31 MED FILL — KETOCONAZOLE 2% CREAM: 2 | 30 days supply | Qty: 60 | Fill #0

## 2018-09-13 MED FILL — KETOCONAZOLE 2% CREAM: 2 | 30 days supply | Qty: 60 | Fill #0

## 2019-03-27 ENCOUNTER — Encounter: Payer: Self-pay | Admitting: Family

## 2019-03-27 ENCOUNTER — Ambulatory Visit (INDEPENDENT_AMBULATORY_CARE_PROVIDER_SITE_OTHER): Payer: Managed Care, Other (non HMO) | Admitting: Family

## 2019-03-27 ENCOUNTER — Other Ambulatory Visit: Payer: Self-pay | Admitting: Family

## 2019-03-27 DIAGNOSIS — K219 Gastro-esophageal reflux disease without esophagitis: Secondary | ICD-10-CM | POA: Diagnosis not present

## 2019-03-27 MED ORDER — OMEPRAZOLE 40 MG PO CPDR: 40.0000 mg | DELAYED_RELEASE_CAPSULE | Freq: Every day | ORAL | 5 refills | Status: DC

## 2019-03-27 MED FILL — OMEPRAZOLE 40 MG CPDR: 40 | 30 days supply | Qty: 30 | Fill #0

## 2019-03-27 NOTE — Progress Notes (Signed)
Virtual Visit via Video Note  I connected with Blake Baldwin on 03/27/19 at 11:20 AM EDT by a video enabled telemedicine application and verified that I am speaking with the correct person using two identifiers.  Location: Patient: home Provider: office   I discussed the limitations of evaluation and management by telemedicine and the availability of in person appointments. The patient expressed understanding and agreed to proceed.  History of Present Illness:   Patient is a 34 yr old male who presents today to discuss gerd.  He reports that when he sleeps, more frequently- he wakes up coughing and it causes him to gag and vomit.  Coughs so hard that he gets a HA and his chest gets sore. He will cough some during the day for a few days after each of these events. This occurred last night.   Observations/Objective:   Gen: Awake, alert, no acute distress Resp: Breathing is even and non-labored Psych: calm/pleasant demeanor Neuro: Alert and Oriented x 3, + facial symmetry, speech is clear.   Assessment and Plan:  GERD- sounds like he is aspirating stomach acid in his sleep. We discussed gerd diet/precautions and recommended that he restart omeprazole once daily.  I would like to see him back in 1 month. We can evaluate his progress and if he is still experiencing joint pain, plan to do some additional evaluation of joint pain at that time.  Follow Up Instructions:    I discussed the assessment and treatment plan with the patient. The patient was provided an opportunity to ask questions and all were answered. The patient agreed with the plan and demonstrated an understanding of the instructions.   The patient was advised to call back or seek an in-person evaluation if the symptoms worsen or if the condition fails to improve as anticipated.  Nance Pear, NP

## 2019-03-27 NOTE — Patient Instructions (Signed)

## 2019-04-26 ENCOUNTER — Telehealth: Payer: Self-pay | Admitting: Family

## 2019-04-26 MED ORDER — OMEPRAZOLE 40 MG PO CPDR: 40.0000 mg | DELAYED_RELEASE_CAPSULE | Freq: Every day | ORAL | 5 refills | Status: DC

## 2019-04-26 MED FILL — OMEPRAZOLE 40 MG CPDR: 40 | 30 days supply | Qty: 30 | Fill #1

## 2019-04-26 NOTE — Telephone Encounter (Signed)
rx sent

## 2019-04-26 NOTE — Telephone Encounter (Signed)
Medication:omeprazole (PRILOSEC) 40 MG capsule   Has the patient contacted their pharmacy? Yes.   (If no, request that the patient contact the pharmacy for the refill.) (If yes, when and what did the pharmacy advise?)  Preferred Pharmacy (with phone number or street name):   [facility]  [place], [place] [address]  Phone:  [phone] Fax:  [phone]  Agent: Please be advised that RX refills may take up to 3 business days. We ask that you follow-up with your pharmacy.

## 2019-05-28 MED FILL — OMEPRAZOLE 40 MG CPDR: 40 | 30 days supply | Qty: 30 | Fill #0

## 2019-05-31 ENCOUNTER — Encounter: Payer: Self-pay | Admitting: Family Medicine

## 2019-05-31 ENCOUNTER — Other Ambulatory Visit: Payer: Self-pay

## 2019-05-31 ENCOUNTER — Ambulatory Visit (INDEPENDENT_AMBULATORY_CARE_PROVIDER_SITE_OTHER): Payer: Managed Care, Other (non HMO) | Admitting: Family Medicine

## 2019-05-31 VITALS — BP 117/81 | HR 50 | Temp 98.5°F | Resp 18 | Ht 72.0 in | Wt 193.0 lb

## 2019-05-31 DIAGNOSIS — L739 Follicular disorder, unspecified: Secondary | ICD-10-CM

## 2019-05-31 DIAGNOSIS — N489 Disorder of penis, unspecified: Secondary | ICD-10-CM

## 2019-05-31 MED ORDER — MUPIROCIN 2 % EX OINT: TOPICAL_OINTMENT | CUTANEOUS | 0 refills | Status: DC

## 2019-05-31 MED FILL — MUPIROCIN 2% OINTMENT: 2 | 7 days supply | Qty: 22 | Fill #0
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
## 2019-05-31 NOTE — Patient Instructions (Signed)
This appears to be folliculitis. Place cream over area twice a day. Do not squeeze it.      Folliculitis  Folliculitis is inflammation of the hair follicles. Folliculitis most commonly occurs on the scalp, thighs, legs, back, and buttocks. However, it can occur anywhere on the body. What are the causes? This condition may be caused by:  A bacterial infection (common).  A fungal infection.  A viral infection.  Contact with certain chemicals, especially oils and tars.  Shaving or waxing.  Greasy ointments or creams applied to the skin. Long-lasting folliculitis and folliculitis that keeps coming back may be caused by bacteria. This bacteria can live anywhere on your skin and is often found in the nostrils. What increases the risk? You are more likely to develop this condition if you have:  A weakened immune system.  Diabetes.  Obesity. What are the signs or symptoms? Symptoms of this condition include:  Redness.  Soreness.  Swelling.  Itching.  Small white or yellow, pus-filled, itchy spots (pustules) that appear over a reddened area. If there is an infection that goes deep into the follicle, these may develop into a boil (furuncle).  A group of closely packed boils (carbuncle). These tend to form in hairy, sweaty areas of the body. How is this diagnosed? This condition is diagnosed with a skin exam. To find what is causing the condition, your health care provider may take a sample of one of the pustules or boils for testing in a lab. How is this treated? This condition may be treated by:  Applying warm compresses to the affected areas.  Taking an antibiotic medicine or applying an antibiotic medicine to the skin.  Applying or bathing with an antiseptic solution.  Taking an over-the-counter medicine to help with itching.  Having a procedure to drain any pustules or boils. This may be done if a pustule or boil contains a lot of pus or fluid.  Having laser  hair removal. This may be done to treat long-lasting folliculitis. Follow these instructions at home: Managing pain and swelling   If directed, apply heat to the affected area as often as told by your health care provider. Use the heat source that your health care provider recommends, such as a moist heat pack or a heating pad. ? Place a towel between your skin and the heat source. ? Leave the heat on for 20-30 minutes. ? Remove the heat if your skin turns bright red. This is especially important if you are unable to feel pain, heat, or cold. You may have a greater risk of getting burned. General instructions  If you were prescribed an antibiotic medicine, take it or apply it as told by your health care provider. Do not stop using the antibiotic even if your condition improves.  Check the irritated area every day for signs of infection. Check for: ? Redness, swelling, or pain. ? Fluid or blood. ? Warmth. ? Pus or a bad smell.  Do not shave irritated skin.  Take over-the-counter and prescription medicines only as told by your health care provider.  Keep all follow-up visits as told by your health care provider. This is important. Get help right away if:  You have more redness, swelling, or pain in the affected area.  Red streaks are spreading from the affected area.  You have a fever. Summary  Folliculitis is inflammation of the hair follicles. Folliculitis most commonly occurs on the scalp, thighs, legs, back, and buttocks.  This condition may be  treated by taking an antibiotic medicine or applying an antibiotic medicine to the skin, and applying or bathing with an antiseptic solution.  If you were prescribed an antibiotic medicine, take it or apply it as told by your health care provider. Do not stop using the antibiotic even if your condition improves.  Get help right away if you have new or worsening symptoms.  Keep all follow-up visits as told by your health care  provider. This is important. This information is not intended to replace advice given to you by your health care provider. Make sure you discuss any questions you have with your health care provider. Document Revised: 08/05/2017 Document Reviewed: 08/05/2017 Elsevier Patient Education  [place].

## 2019-06-03 ENCOUNTER — Ambulatory Visit: Payer: Managed Care, Other (non HMO) | Admitting: Family

## 2019-06-28 ENCOUNTER — Other Ambulatory Visit: Payer: Self-pay | Admitting: Family

## 2019-06-28 ENCOUNTER — Telehealth: Payer: Self-pay | Admitting: Family

## 2019-06-28 MED ORDER — OMEPRAZOLE 40 MG PO CPDR: 40.0000 mg | DELAYED_RELEASE_CAPSULE | Freq: Every day | ORAL | 5 refills | Status: DC

## 2019-06-28 MED FILL — OMEPRAZOLE 40 MG CPDR: 40 | 30 days supply | Qty: 30 | Fill #0

## 2019-06-28 NOTE — Telephone Encounter (Signed)
Medication: omeprazole (PRILOSEC) 40 MG capsule      Has the patient contacted their pharmacy?  (If no, request that the patient contact the pharmacy for the refill.) (If yes, when and what did the pharmacy advise?)     Preferred Pharmacy (with phone number or street name): [facility] [address]  [place], [place] [address]  Phone:  [phone] Fax:  [phone]      Agent: Please be advised that RX refills may take up to 3 business days. We ask that you follow-up with your pharmacy.

## 2019-08-06 MED FILL — OMEPRAZOLE 40 MG CPDR: 40 | 30 days supply | Qty: 30 | Fill #1

## 2019-09-05 MED FILL — OMEPRAZOLE 40 MG CPDR: 40 | 30 days supply | Qty: 30 | Fill #2

## 2019-10-07 MED FILL — OMEPRAZOLE 40 MG CPDR: 40 | 30 days supply | Qty: 30 | Fill #3

## 2019-11-05 MED FILL — OMEPRAZOLE 40 MG CPDR: 40 | 30 days supply | Qty: 30 | Fill #4

## 2019-12-09 MED FILL — OMEPRAZOLE 40 MG CPDR: 40 | 30 days supply | Qty: 30 | Fill #5

## 2020-02-05 MED FILL — OMEPRAZOLE 40 MG CPDR: 40 | 30 days supply | Qty: 30 | Fill #2

## 2020-03-05 MED FILL — OMEPRAZOLE 40 MG CPDR: 40 | 30 days supply | Qty: 30 | Fill #3

## 2020-04-07 ENCOUNTER — Other Ambulatory Visit: Payer: Self-pay | Admitting: Family

## 2020-04-07 MED FILL — OMEPRAZOLE 40 MG CPDR: 40 | 30 days supply | Qty: 30 | Fill #0

## 2020-04-11 ENCOUNTER — Other Ambulatory Visit (HOSPITAL_BASED_OUTPATIENT_CLINIC_OR_DEPARTMENT_OTHER): Payer: Self-pay

## 2020-05-07 ENCOUNTER — Other Ambulatory Visit (HOSPITAL_BASED_OUTPATIENT_CLINIC_OR_DEPARTMENT_OTHER): Payer: Self-pay

## 2020-06-09 ENCOUNTER — Other Ambulatory Visit: Payer: Self-pay | Admitting: Family

## 2020-06-09 ENCOUNTER — Other Ambulatory Visit (HOSPITAL_BASED_OUTPATIENT_CLINIC_OR_DEPARTMENT_OTHER): Payer: Self-pay

## 2020-06-10 ENCOUNTER — Other Ambulatory Visit (HOSPITAL_BASED_OUTPATIENT_CLINIC_OR_DEPARTMENT_OTHER): Payer: Self-pay

## 2020-06-11 ENCOUNTER — Other Ambulatory Visit (HOSPITAL_BASED_OUTPATIENT_CLINIC_OR_DEPARTMENT_OTHER): Payer: Self-pay
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
## 2020-06-15 ENCOUNTER — Other Ambulatory Visit (HOSPITAL_BASED_OUTPATIENT_CLINIC_OR_DEPARTMENT_OTHER): Payer: Self-pay

## 2020-06-15 ENCOUNTER — Other Ambulatory Visit: Payer: Self-pay

## 2020-06-15 ENCOUNTER — Encounter: Payer: Self-pay | Admitting: Family

## 2020-06-15 ENCOUNTER — Ambulatory Visit (INDEPENDENT_AMBULATORY_CARE_PROVIDER_SITE_OTHER): Payer: 59 | Admitting: Family

## 2020-06-15 VITALS — BP 126/81 | HR 77 | Temp 98.6°F | Resp 16 | Ht 72.0 in | Wt 199.6 lb

## 2020-06-15 DIAGNOSIS — K2100 Gastro-esophageal reflux disease with esophagitis, without bleeding: Secondary | ICD-10-CM

## 2020-06-15 DIAGNOSIS — R2230 Localized swelling, mass and lump, unspecified upper limb: Secondary | ICD-10-CM

## 2020-06-15 MED ORDER — OMEPRAZOLE 40 MG PO CPDR
DELAYED_RELEASE_CAPSULE | Freq: Every day | ORAL | 1 refills | Status: DC
  Filled 2020-06-15: qty 30, 30d supply, fill #0
  Filled 2020-07-17: qty 30, 30d supply, fill #1

## 2020-06-15 NOTE — Patient Instructions (Signed)
You should be contacted about scheduling your ultrasound. Please restart omeprazole.

## 2020-06-15 NOTE — Assessment & Plan Note (Signed)
Uncontrolled off of omeprazole. Resume 40mg  once daily.

## 2020-06-15 NOTE — Progress Notes (Signed)
Subjective:   By signing my name below, I, Shehryar Baig, attest that this documentation has been prepared under the direction and in the presence of [name] NP. 06/15/2020     Patient ID: Blake Baldwin, male    DOB: 1985-04-20, 35 y.o.   MRN: [id number]  No chief complaint on file.   HPI Patient is in today for a office visit. He complains of a non-painful mass on his right shoulder. He currently works at [employer] and worries about the recovery time if he gets a procedure done to remove it. He does not have any Covid-19 vaccines at this time.   GERD- He complains of his acid reflex flaring up recently. He stopped taking 40 mg omeprazole daily PO and reports that his symptoms came back shortly after.  Diet- He does not drink soda or frequently eat fried meals.   Health Maintenance Due  Topic Date Due  . HIV Screening  Never done  . Hepatitis C Screening  Never done    Past Medical History:  Diagnosis Date  . Bronchitis   . Gallstones     Past Surgical History:  Procedure Laterality Date  . APPENDECTOMY    . CHOLECYSTECTOMY      Family History  Problem Relation Age of Onset  . Hypertension Mother   . Other Mother        hyperaldosteronism and padgett's disease  . Asthma Mother   . Heart disease Maternal Aunt   . Diabetes Maternal Aunt   . Diabetes Maternal Uncle   . Diabetes Cousin     Social History   Socioeconomic History  . Marital status: Divorced    Spouse name: Not on file  . Number of children: Not on file  . Years of education: Not on file  . Highest education level: Not on file  Occupational History  . Not on file  Tobacco Use  . Smoking status: Former Smoker    Packs/day: 0.50    Types: Cigarettes    Quit date: 11/25/2015    Years since quitting: 4.5  . Smokeless tobacco: Former Network engineer  . Vaping Use: Never used  Substance and Sexual Activity  . Alcohol use: Yes    Comment: occ  . Drug use: No  . Sexual activity:  Not on file  Other Topics Concern  . Not on file  Social History Narrative   In middle of divorce   Has 2 children   Works at [employer] Strain: Not on file  Food Insecurity: Not on file  Transportation Needs: Not on file  Physical Activity: Not on file  Stress: Not on file  Social Connections: Not on file  Intimate Partner Violence: Not on file    Outpatient Medications Prior to Visit  Medication Sig Dispense Refill  . ibuprofen (ADVIL,MOTRIN) 600 MG tablet Take 1 tablet (600 mg total) by mouth every 6 (six) hours as needed. 30 tablet 0  . mupirocin ointment (BACTROBAN) 2 % Apply to area every 12 hours until resolved. 22 g 0  . omeprazole (PRILOSEC) 40 MG capsule TAKE 1 CAPSULE (40 MG TOTAL) BY MOUTH DAILY. 30 capsule 1   No facility-administered medications prior to visit.    Allergies  Allergen Reactions  . Diclofenac     anxiety  . Dilaudid [Hydromorphone Hcl] Itching and Rash    ROS    see HPI  Objective:    Physical  Exam Constitutional:      General: He is not in acute distress.    Appearance: Normal appearance. He is not ill-appearing.  HENT:     Head: Normocephalic and atraumatic.     Right Ear: External ear normal.     Left Ear: External ear normal.  Eyes:     Extraocular Movements: Extraocular movements intact.     Pupils: Pupils are equal, round, and reactive to light.  Pulmonary:     Effort: Pulmonary effort is normal.  Musculoskeletal:     Comments: 2-3 in mass located on right shoulder firm, non-tender  Skin:    General: Skin is warm and dry.  Neurological:     Mental Status: He is alert and oriented to person, place, and time.  Psychiatric:        Behavior: Behavior normal.     There were no vitals taken for this visit. Wt Readings from Last 3 Encounters:  05/31/19 193 lb (87.5 kg)  08/28/18 198 lb 8 oz (90 kg)  12/18/17 187 lb (84.8 kg)       Assessment & Plan:   Problem List  Items Addressed This Visit   None      No orders of the defined types were placed in this encounter.   I, [name] NP, personally preformed the services described in this documentation.  All medical record entries made by the scribe were at my direction and in my presence.  I have reviewed the chart and discharge instructions (if applicable) and agree that the record reflects my personal performance and is accurate and complete. 06/15/2020   I,Shehryar Baig,acting as a scribe for Nance Pear, NP.,have documented all relevant documentation on the behalf of Nance Pear, NP,as directed by  Nance Pear, NP while in the presence of Nance Pear, NP.   Shehryar [employer]

## 2020-06-15 NOTE — Assessment & Plan Note (Signed)
Pt feels that it has increased some in size. Plan [place] for further evaluation and then likely surgical referral.

## 2020-06-17 ENCOUNTER — Other Ambulatory Visit (HOSPITAL_BASED_OUTPATIENT_CLINIC_OR_DEPARTMENT_OTHER): Payer: Self-pay

## 2020-06-23 ENCOUNTER — Ambulatory Visit (HOSPITAL_BASED_OUTPATIENT_CLINIC_OR_DEPARTMENT_OTHER)
Admission: RE | Admit: 2020-06-23 | Discharge: 2020-06-23 | Disposition: A | Discharge status: Home / Self Care | Payer: 59 | Source: Ambulatory Visit | Attending: Family | Admitting: Family

## 2020-06-23 ENCOUNTER — Other Ambulatory Visit: Payer: Self-pay

## 2020-06-23 DIAGNOSIS — R2230 Localized swelling, mass and lump, unspecified upper limb: Secondary | ICD-10-CM | POA: Diagnosis present

## 2020-06-24 ENCOUNTER — Telehealth: Payer: Self-pay | Admitting: Family

## 2020-06-24 ENCOUNTER — Other Ambulatory Visit: Payer: Self-pay

## 2020-06-24 DIAGNOSIS — D1722 Benign lipomatous neoplasm of skin and subcutaneous tissue of left arm: Secondary | ICD-10-CM

## 2020-06-24 NOTE — Telephone Encounter (Signed)
Patient advised of [place] report he will like to precede with GS referral.  Referral entered for [facility], left shoulder mass/ lipoma

## 2020-06-24 NOTE — Telephone Encounter (Signed)
Ultrasound shows that mass on his right shoulder is fatty tissue or a "lipoma."  Not worrisome. If he is interested in having it removed, please let me know and I will refer him to a general surgeon.

## 2020-06-24 NOTE — Addendum Note (Signed)
Addended by: [name] on: 06/24/2020 03:36 PM   Modules accepted: Orders

## 2020-07-17 ENCOUNTER — Other Ambulatory Visit (HOSPITAL_BASED_OUTPATIENT_CLINIC_OR_DEPARTMENT_OTHER): Payer: Self-pay

## 2020-10-23 ENCOUNTER — Other Ambulatory Visit: Payer: Self-pay | Admitting: Family Medicine

## 2020-10-23 ENCOUNTER — Other Ambulatory Visit (HOSPITAL_BASED_OUTPATIENT_CLINIC_OR_DEPARTMENT_OTHER): Payer: Self-pay

## 2020-10-28 ENCOUNTER — Telehealth: Payer: Self-pay

## 2020-10-28 NOTE — Telephone Encounter (Signed)
Error

## 2020-10-30 ENCOUNTER — Other Ambulatory Visit: Payer: Self-pay

## 2020-10-30 ENCOUNTER — Ambulatory Visit (INDEPENDENT_AMBULATORY_CARE_PROVIDER_SITE_OTHER): Payer: 59 | Admitting: Family

## 2020-10-30 ENCOUNTER — Other Ambulatory Visit (HOSPITAL_BASED_OUTPATIENT_CLINIC_OR_DEPARTMENT_OTHER): Payer: Self-pay

## 2020-10-30 VITALS — BP 122/79 | HR 94 | Temp 98.5°F | Resp 16 | Ht 72.0 in | Wt 198.0 lb

## 2020-10-30 DIAGNOSIS — K2100 Gastro-esophageal reflux disease with esophagitis, without bleeding: Secondary | ICD-10-CM | POA: Diagnosis not present

## 2020-10-30 DIAGNOSIS — D1721 Benign lipomatous neoplasm of skin and subcutaneous tissue of right arm: Secondary | ICD-10-CM

## 2020-10-30 MED ORDER — OMEPRAZOLE 40 MG PO CPDR
40.0000 mg | DELAYED_RELEASE_CAPSULE | Freq: Every day | ORAL | 4 refills | Status: DC
  Filled 2020-10-30: qty 90, 90d supply, fill #0
  Filled 2020-10-30 – 2021-10-26 (×3): qty 30, 30d supply, fill #0

## 2020-10-30 NOTE — Assessment & Plan Note (Signed)
Stable on omeprazole 40mg once daily. Continue same.  

## 2020-10-30 NOTE — Assessment & Plan Note (Signed)
Unchanged. Will re-initiate referral to general surgeon for excision at pt's request.

## 2020-10-30 NOTE — Progress Notes (Signed)
Subjective:   By signing my name below, I, Blake Baldwin, attest that this documentation has been prepared under the direction and in the presence of Blake Alar, NP 10/30/2020      Patient ID: Blake Baldwin, male    DOB: 1985-09-17, 35 y.o.   MRN: [id number]  Chief Complaint  Patient presents with   Gastroesophageal Reflux    "Doing well on omeprazole"   Mass    Patient reports right shoulder mass.     HPI Patient is in today for an office visit  Reflux- He manages his reflux with 40 mg omeprazole and is doing well on it. He occasionally skips some days but does not feel the effect on that day. He has not taken it for a week and mentions feeling a little reflux. He avoids drinking soda but uses energy drinks occasionally since he works the night shift.  Lipoma- He never received the call from [place] [facility] for excision of his lipoma and would like to get another referral. It has not been giving him any trouble but would still like to have it taken off because it feels uncomfortable.   Immunizations- He is not interested in the flu or Covid-19 vaccines.  Past Medical History:  Diagnosis Date   Bronchitis    Gallstones     Past Surgical History:  Procedure Laterality Date   APPENDECTOMY     CHOLECYSTECTOMY      Family History  Problem Relation Age of Onset   Hypertension Mother    Other Mother        hyperaldosteronism and padgett's disease   Asthma Mother    Heart disease Maternal Aunt    Diabetes Maternal Aunt    Diabetes Maternal Uncle    Diabetes Cousin     Social History   Socioeconomic History   Marital status: Divorced    Spouse name: Not on file   Number of children: Not on file   Years of education: Not on file   Highest education level: Not on file  Occupational History   Not on file  Tobacco Use   Smoking status: Former    Packs/day: 0.50    Types: Cigarettes    Quit date: 11/25/2015    Years since quitting: 4.9   Smokeless  tobacco: Former  Scientific laboratory technician Use: Never used  Substance and Sexual Activity   Alcohol use: Yes    Comment: occ   Drug use: No   Sexual activity: Not on file  Other Topics Concern   Not on file  Social History Narrative   In middle of divorce   Has 2 children   Works at [employer] ex   Social Determinants of Radio broadcast assistant Strain: Not on file  Food Insecurity: Not on file  Transportation Needs: Not on file  Physical Activity: Not on file  Stress: Not on file  Social Connections: Not on file  Intimate Partner Violence: Not on file    Outpatient Medications Prior to Visit  Medication Sig Dispense Refill   omeprazole (PRILOSEC) 40 MG capsule Take 1 capsule by mouth once daily 30 capsule 0   No facility-administered medications prior to visit.    Allergies  Allergen Reactions   Diclofenac     anxiety   Dilaudid [Hydromorphone Hcl] Itching and Rash    Review of Systems  Constitutional:  Negative for fever.  HENT:  Negative for ear pain and hearing loss.        (-)  nystagmus (-)adenopathy  Eyes:  Negative for blurred vision.  Respiratory:  Negative for cough, shortness of breath and wheezing.   Cardiovascular:  Negative for chest pain and leg swelling.  Gastrointestinal:  Negative for blood in stool, diarrhea, nausea and vomiting.       (+) acid reflux  Genitourinary:  Negative for dysuria and frequency.  Musculoskeletal:  Negative for joint pain and myalgias.  Skin:  Negative for rash.  Neurological:  Negative for headaches.  Psychiatric/Behavioral:  Negative for depression. The patient is not nervous/anxious.       Objective:    Physical Exam Constitutional:      General: He is not in acute distress.    Appearance: Normal appearance.  HENT:     Head: Normocephalic and atraumatic.  Cardiovascular:     Rate and Rhythm: Normal rate and regular rhythm.     Heart sounds: No murmur heard. Pulmonary:     Effort: No respiratory distress.      Breath sounds: Normal breath sounds. No wheezing or rales.  Skin:    General: Skin is warm and dry.     Comments: Golf ball sized lipoma on right shoulder  Neurological:     Mental Status: He is alert and oriented to person, place, and time.  Psychiatric:        Behavior: Behavior normal.        Thought Content: Thought content normal.    BP 122/79 (BP Location: Right Arm, Patient Position: Sitting, Cuff Size: Large)   Pulse 94   Temp 98.5 F (36.9 C) (Oral)   Resp 16   Ht 6' (1.829 m)   Wt 198 lb (89.8 kg)   SpO2 98%   BMI 26.85 kg/m  Wt Readings from Last 3 Encounters:  10/30/20 198 lb (89.8 kg)  06/15/20 199 lb 9.6 oz (90.5 kg)  05/31/19 193 lb (87.5 kg)    Diabetic Foot Exam - Simple   No data filed    Lab Results  Component Value Date   WBC 5.7 11/08/2017   HGB 16.0 11/08/2017   HCT 45.3 11/08/2017   PLT 164.0 11/08/2017   GLUCOSE 89 11/08/2017   CHOL 161 01/18/2012   TRIG 82 01/18/2012   HDL 34 (L) 01/18/2012   LDLCALC 111 (H) 01/18/2012   ALT 16 11/08/2017   AST 11 11/08/2017   NA 140 11/08/2017   K 4.0 11/08/2017   CL 105 11/08/2017   CREATININE 0.81 11/08/2017   BUN 13 11/08/2017   CO2 26 11/08/2017   TSH 0.77 11/08/2017    Lab Results  Component Value Date   TSH 0.77 11/08/2017   Lab Results  Component Value Date   WBC 5.7 11/08/2017   HGB 16.0 11/08/2017   HCT 45.3 11/08/2017   MCV 92.5 11/08/2017   PLT 164.0 11/08/2017   Lab Results  Component Value Date   NA 140 11/08/2017   K 4.0 11/08/2017   CO2 26 11/08/2017   GLUCOSE 89 11/08/2017   BUN 13 11/08/2017   CREATININE 0.81 11/08/2017   BILITOT 0.8 11/08/2017   ALKPHOS 63 11/08/2017   AST 11 11/08/2017   ALT 16 11/08/2017   PROT 7.3 11/08/2017   ALBUMIN 4.8 11/08/2017   CALCIUM 9.8 11/08/2017   ANIONGAP 8 05/25/2016   GFR 117.78 11/08/2017   Lab Results  Component Value Date   CHOL 161 01/18/2012   Lab Results  Component Value Date   HDL 34 (L) 01/18/2012   Lab  Results  Component Value Date   LDLCALC 111 (H) 01/18/2012   Lab Results  Component Value Date   TRIG 82 01/18/2012   Lab Results  Component Value Date   CHOLHDL 4.7 01/18/2012   No results found for: HGBA1C     Assessment & Plan:   Problem List Items Addressed This Visit       Unprioritized   Lipoma of right shoulder - Primary    Unchanged. Will re-initiate referral to general surgeon for excision at pt's request.       Relevant Orders   Ambulatory referral to General Surgery   GERD (gastroesophageal reflux disease)    Stable on omeprazole 40mg  once daily. Continue same.      Relevant Medications   omeprazole (PRILOSEC) 40 MG capsule    Meds ordered this encounter  Medications   omeprazole (PRILOSEC) 40 MG capsule    Sig: Take 1 capsule (40 mg total) by mouth daily.    Dispense:  90 capsule    Refill:  4    Requested drug refills are authorized, however, the patient needs further evaluation and/or laboratory testing before further refills are given. Ask him to make an appointment for this.   Pt declines flu shot.  We discussed covid vaccination. He is unvaccinated.   I,Blake Baldwin,acting as a Education administrator for [employer], NP.,have documented all relevant documentation on the behalf of Nance Pear, NP,as directed by  Nance Pear, NP while in the presence of Nance Pear, NP.   I, Blake Alar, NP, personally preformed the services described in this documentation.  All medical record entries made by the scribe were at my direction and in my presence.  I have reviewed the chart and discharge instructions (if applicable) and agree that the record reflects my personal performance and is accurate and complete. 10/30/2020

## 2020-12-07 ENCOUNTER — Other Ambulatory Visit (HOSPITAL_BASED_OUTPATIENT_CLINIC_OR_DEPARTMENT_OTHER): Payer: Self-pay

## 2020-12-11 ENCOUNTER — Encounter: Payer: 59 | Admitting: Family

## 2020-12-11 NOTE — Progress Notes (Incomplete)
Subjective:   By signing my name below, I, Lyric Barr-McArthur, attest that this documentation has been prepared under the direction and in the presence of [name], NP, 12/11/2020   Patient ID: Blake Baldwin, male    DOB: [birth year], 35 y.o.   MRN: [id number]  No chief complaint on file.   HPI Patient is in today for a comprehensive physical exam.  He denies any fever, unexpected weight change, adenopathy, rash, hearing loss, ear pain, rhinorrhea, visual disturbances, eye pain, chest pain, leg swelling, cough, nausea, vomitting, diarrhea, blood in stool, dysuria, frequency, myalgias, arthralgias, headaches, depression or anxiety.   Immunizations: Diet: Exercise: Alcohol:  Drugs: Tobacco: Dental: Vision: SHx: FMHx:     Health Maintenance Due  Topic Date Due   COVID-19 Vaccine (1) Never done   HIV Screening  Never done   Hepatitis C Screening  Never done    Past Medical History:  Diagnosis Date   Bronchitis    Gallstones     Past Surgical History:  Procedure Laterality Date   APPENDECTOMY     CHOLECYSTECTOMY      Family History  Problem Relation Age of Onset   Hypertension Mother    Other Mother        hyperaldosteronism and padgett's disease   Asthma Mother    Heart disease Maternal Aunt    Diabetes Maternal Aunt    Diabetes Maternal Uncle    Diabetes Cousin     Social History   Socioeconomic History   Marital status: Divorced    Spouse name: Not on file   Number of children: Not on file   Years of education: Not on file   Highest education level: Not on file  Occupational History   Not on file  Tobacco Use   Smoking status: Former    Packs/day: 0.50    Types: Cigarettes    Quit date: 11/25/2015    Years since quitting: 5.0   Smokeless tobacco: Former  Scientific laboratory technician Use: Never used  Substance and Sexual Activity   Alcohol use: Yes    Comment: occ   Drug use: No   Sexual activity: Not on file  Other Topics Concern    Not on file  Social History Narrative   In middle of divorce   Has 2 children   Works at [employer] ex   Social Determinants of Radio broadcast assistant Strain: Not on file  Food Insecurity: Not on file  Transportation Needs: Not on file  Physical Activity: Not on file  Stress: Not on file  Social Connections: Not on file  Intimate Partner Violence: Not on file    Outpatient Medications Prior to Visit  Medication Sig Dispense Refill   omeprazole (PRILOSEC) 40 MG capsule Take 1 capsule (40 mg total) by mouth daily **Need appointment for further refills** 90 capsule 4   No facility-administered medications prior to visit.    Allergies  Allergen Reactions   Diclofenac     anxiety   Dilaudid [Hydromorphone Hcl] Itching and Rash    Review of Systems  Constitutional:  Negative for fever.       (-) unexpected weight changes (-) adenopathy   HENT:  Negative for ear pain and hearing loss.        (-) rhinorrhea  Eyes:  Negative for pain.       (-) visual disturbances   Respiratory:  Negative for cough.   Cardiovascular:  Negative for chest pain and leg  swelling.  Gastrointestinal:  Negative for blood in stool, diarrhea, nausea and vomiting.  Genitourinary:  Negative for dysuria and frequency.  Musculoskeletal:  Negative for joint pain and myalgias.  Skin:  Negative for rash.  Neurological:  Negative for headaches.  Psychiatric/Behavioral:  Negative for depression. The patient is not nervous/anxious.       Objective:    Physical Exam Constitutional:      General: He is not in acute distress.    Appearance: Normal appearance. He is not ill-appearing.  HENT:     Head: Normocephalic and atraumatic.     Right Ear: Tympanic membrane, ear canal and external ear normal.     Left Ear: Tympanic membrane, ear canal and external ear normal.  Eyes:     Extraocular Movements: Extraocular movements intact.     Pupils: Pupils are equal, round, and reactive to light.     Comments:  (-) nystagmus  Cardiovascular:     Rate and Rhythm: Normal rate and regular rhythm.     Heart sounds: Normal heart sounds. No murmur heard.   No gallop.  Pulmonary:     Effort: Pulmonary effort is normal. No respiratory distress.     Breath sounds: Normal breath sounds. No wheezing or rales.  Musculoskeletal:     Comments: (+) 5/5 upper and lower extremity strength  Lymphadenopathy:     Cervical: No cervical adenopathy.  Skin:    General: Skin is warm and dry.  Neurological:     Mental Status: He is alert and oriented to person, place, and time.  Psychiatric:        Behavior: Behavior normal.        Judgment: Judgment normal.    There were no vitals taken for this visit. Wt Readings from Last 3 Encounters:  10/30/20 198 lb (89.8 kg)  06/15/20 199 lb 9.6 oz (90.5 kg)  05/31/19 193 lb (87.5 kg)       Assessment & Plan:   Problem List Items Addressed This Visit   None  No orders of the defined types were placed in this encounter.   I, [name], NP, personally preformed the services described in this documentation.  All medical record entries made by the scribe were at my direction and in my presence.  I have reviewed the chart and discharge instructions (if applicable) and agree that the record reflects my personal performance and is accurate and complete. 12/11/2020  I,Lyric Barr-McArthur,acting as a Education administrator for Nance Pear, NP.,have documented all relevant documentation on the behalf of Nance Pear, NP,as directed by  Nance Pear, NP while in the presence of Nance Pear, NP.  Lyric Barr-McArthur

## 2020-12-13 ENCOUNTER — Encounter (HOSPITAL_BASED_OUTPATIENT_CLINIC_OR_DEPARTMENT_OTHER): Payer: Self-pay | Admitting: Emergency Medicine

## 2020-12-13 ENCOUNTER — Emergency Department (HOSPITAL_BASED_OUTPATIENT_CLINIC_OR_DEPARTMENT_OTHER)
Admission: EM | Admit: 2020-12-13 | Discharge: 2020-12-13 | Disposition: A | Discharge status: Home / Self Care | Payer: 59 | Attending: Emergency Medicine | Admitting: Emergency Medicine

## 2020-12-13 ENCOUNTER — Other Ambulatory Visit: Payer: Self-pay

## 2020-12-13 DIAGNOSIS — B349 Viral infection, unspecified: Secondary | ICD-10-CM

## 2020-12-13 DIAGNOSIS — Z87891 Personal history of nicotine dependence: Secondary | ICD-10-CM | POA: Insufficient documentation

## 2020-12-13 DIAGNOSIS — J029 Acute pharyngitis, unspecified: Secondary | ICD-10-CM | POA: Diagnosis present

## 2020-12-13 DIAGNOSIS — U071 COVID-19: Secondary | ICD-10-CM | POA: Insufficient documentation

## 2020-12-13 LAB — RESP PANEL BY RT-PCR (FLU A&B, COVID) ARPGX2
Influenza A by PCR: NEGATIVE
Influenza B by PCR: NEGATIVE
SARS Coronavirus 2 by RT PCR: POSITIVE — AB

## 2020-12-13 MED ORDER — DIPHENHYDRAMINE HCL 25 MG PO TABS: 25.0000 mg | ORAL_TABLET | Freq: Three times a day (TID) | ORAL | 0 refills | Status: DC | PRN

## 2020-12-13 MED ORDER — IBUPROFEN 400 MG PO TABS
600.0000 mg | ORAL_TABLET | Freq: Once | ORAL | Status: AC
  Administered 2020-12-13: 19:00:00 600 mg via ORAL
  Filled 2020-12-13: qty 1

## 2020-12-13 MED ORDER — ONDANSETRON 4 MG PO TBDP: 4.0000 mg | ORAL_TABLET | Freq: Three times a day (TID) | ORAL | 0 refills | Status: DC | PRN

## 2020-12-13 MED ORDER — DIPHENHYDRAMINE HCL 25 MG PO CAPS
25.0000 mg | ORAL_CAPSULE | Freq: Once | ORAL | Status: AC
  Administered 2020-12-13: 19:00:00 25 mg via ORAL
  Filled 2020-12-13: qty 1

## 2020-12-13 MED ORDER — ONDANSETRON 4 MG PO TBDP
4.0000 mg | ORAL_TABLET | Freq: Once | ORAL | Status: AC
  Administered 2020-12-13: 19:00:00 4 mg via ORAL
  Filled 2020-12-13: qty 1

## 2020-12-13 NOTE — ED Triage Notes (Signed)
Pt reports sore throat for the past few days. Last night began to have body aches, cough and emesis.

## 2020-12-13 NOTE — Discharge Instructions (Addendum)
You tested positive for COVID-19 today.  Please drink plenty of water.  Take Benadryl 25-50 mg at bedtime as needed  Please use Tylenol or ibuprofen for pain.  You may use 600 mg ibuprofen every 6 hours or 1000 mg of Tylenol every 6 hours.  You may choose to alternate between the 2.  This would be most effective.  Not to exceed 4 g of Tylenol within 24 hours.  Not to exceed 3200 mg ibuprofen 24 hours.   Viral Illness TREATMENT  Treatment is directed at relieving symptoms. There is no cure. Antibiotics are not effective, because the infection is caused by a virus, not by bacteria. Treatment may include:  Increased fluid intake. Sports drinks offer valuable electrolytes, sugars, and fluids.  Breathing heated mist or steam (vaporizer or shower).  Eating chicken soup or other clear broths, and maintaining good nutrition.  Getting plenty of rest.  Using gargles or lozenges for comfort.  Increasing usage of your inhaler if you have asthma.  Return to work when your temperature has returned to normal.  Gargle warm salt water and spit it out for sore throat. Take benadryl to decrease sinus secretions. Continue to alternate between Tylenol and ibuprofen for pain and fever control.  Follow Up: Follow up with your primary care doctor in 5-7 days for recheck of ongoing symptoms.  Return to emergency department for emergent changing or worsening of symptoms.

## 2020-12-13 NOTE — ED Provider Notes (Signed)
[facility] [facility] EMERGENCY DEPARTMENT Provider Note   CSN: [id number] Arrival date & time: 12/13/20  1848     History Chief Complaint  Patient presents with   Emesis   Sore Throat    MARKEIS ALLMAN is a 35 y.o. male.  HPI Patient is a 35 year old male with past medical history significant for reflux, tobacco use, musculoskeletal pain, bronchitis  Patient is presented to emergency room today with complaints of sore throat, myalgias, cough states that he felt nauseous today and had 1 episode of nonbloody nonbilious emesis.  Denies any abdominal pain or chest pain.  States he feels that his cough has been frequent dry and hacking.  Denies any fevers.      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   Thrombocytopenia ([place]) 01/18/2012   History of tobacco abuse 01/18/2012    Past Surgical History:  Procedure Laterality Date   APPENDECTOMY     CHOLECYSTECTOMY         Family History  Problem Relation Age of Onset   Hypertension Mother    Other Mother        hyperaldosteronism and padgett's disease   Asthma Mother    Heart disease Maternal Aunt    Diabetes Maternal Aunt    Diabetes Maternal Uncle    Diabetes Cousin     Social History   Tobacco Use   Smoking status: Former    Packs/day: 0.50    Types: Cigarettes    Quit date: 11/25/2015    Years since quitting: 5.0   Smokeless tobacco: Former  Scientific laboratory technician Use: Never used  Substance Use Topics   Alcohol use: Yes    Comment: occ   Drug use: No    Home Medications Prior to Admission medications   Medication Sig Start Date End Date Taking? Authorizing Provider  diphenhydrAMINE  (BENADRYL) 25 MG tablet Take 1-2 tablets (25-50 mg total) by mouth every 8 (eight) hours as needed. 12/13/20  Yes Hadyn Azer S, PA  ondansetron (ZOFRAN-ODT) 4 MG disintegrating tablet Take 1 tablet (4 mg total) by mouth every 8 (eight) hours as needed for nausea or vomiting. 12/13/20  Yes Sheletha Bow, Kathleene Hazel, PA  omeprazole (PRILOSEC) 40 MG capsule Take 1 capsule (40 mg total) by mouth daily **Need appointment for further refills** 10/30/20   [name], NP    Allergies    Diclofenac and Dilaudid [hydromorphone hcl]  Review of Systems   Review of Systems  Constitutional:  Positive for fatigue. Negative for chills and fever.  HENT:  Positive for postnasal drip, rhinorrhea and sore throat. Negative for congestion.   Eyes:  Negative for pain and redness.  Respiratory:  Positive for cough. Negative for shortness of breath.   Cardiovascular:  Negative for chest pain and leg swelling.  Gastrointestinal:  Positive for nausea and vomiting. Negative for abdominal pain and diarrhea.  Endocrine: Negative for polyphagia.  Genitourinary:  Negative for dysuria.  Musculoskeletal:  Positive for myalgias.  Skin:  Negative for rash.  Neurological:  Negative for dizziness, syncope and headaches.  Psychiatric/Behavioral:  Negative for confusion.    Physical Exam Updated Vital Signs BP 121/80 (BP  Location: Right Arm)   Pulse 86   Temp 98.6 F (37 C) (Oral)   Resp 18   SpO2 96%   Physical Exam Vitals and nursing note reviewed.  Constitutional:      General: He is not in acute distress. HENT:     Head: Normocephalic and atraumatic.     Nose: Nose normal.     Mouth/Throat:     Mouth: Mucous membranes are moist.     Comments: Clear PND and posterior pharynx. Eyes:     General: No scleral icterus. Cardiovascular:     Rate and Rhythm: Normal rate and regular rhythm.     Pulses: Normal pulses.     Heart sounds: Normal heart sounds.  Pulmonary:     Effort: Pulmonary effort is normal. No  respiratory distress.     Breath sounds: No wheezing.  Abdominal:     Palpations: Abdomen is soft.     Tenderness: There is no abdominal tenderness. There is no guarding or rebound.  Musculoskeletal:     Cervical back: Normal range of motion.     Right lower leg: No edema.     Left lower leg: No edema.  Skin:    General: Skin is warm and dry.     Capillary Refill: Capillary refill takes less than 2 seconds.  Neurological:     Mental Status: He is alert. Mental status is at baseline.  Psychiatric:        Mood and Affect: Mood normal.        Behavior: Behavior normal.    ED Results / Procedures / Treatments   Labs (all labs ordered are listed, but only abnormal results are displayed) Labs Reviewed  RESP PANEL BY RT-PCR (FLU A&B, COVID) ARPGX2 - Abnormal; Notable for the following components:      Result Value   SARS Coronavirus 2 by RT PCR POSITIVE (*)    All other components within normal limits    EKG None  Radiology No results found.  Procedures Procedures   Medications Ordered in ED Medications  ibuprofen (ADVIL) tablet 600 mg (600 mg Oral Given 12/13/20 1924)  diphenhydrAMINE (BENADRYL) capsule 25 mg (25 mg Oral Given 12/13/20 1924)  ondansetron (ZOFRAN-ODT) disintegrating tablet 4 mg (4 mg Oral Given 12/13/20 1923)    ED Course  I have reviewed the triage vital signs and the nursing notes.  Pertinent labs & imaging results that were available during my care of the patient were reviewed by me and considered in my medical decision making (see chart for details).    MDM Rules/Calculators/A&P                          Patient presents to the emergency room today with complaints of sore throat for the past 2 days also cough congestion myalgias fatigue  Found to be positive for COVID-19 on PCR.  Negative for influenza  Vital signs within normal limits.  Temperature 99.6 however will provide with ibuprofen and reevaluate.  Patient with significant improvement in  symptoms after Benadryl, ibuprofen and Zofran.  States he is no longer nauseous.  Has drank several cups of water here in the ER and tolerated p.o.  Well-appearing.  Abdomen soft on exam.  Lungs clear to auscultation vital signs within normal limits.  Will discharge home with return precautions.  Patient agreeable to plan.  LILTON PARE was evaluated in Emergency Department on 12/15/2020 for the symptoms described in the history  of present illness. He was evaluated in the context of the global COVID-19 pandemic, which necessitated consideration that the patient might be at risk for infection with the SARS-CoV-2 virus that causes COVID-19. Institutional protocols and algorithms that pertain to the evaluation of patients at risk for COVID-19 are in a state of rapid change based on information released by regulatory bodies including the CDC and federal and state organizations. These policies and algorithms were followed during the patient's care in the ED.   Final Clinical Impression(s) / ED Diagnoses Final diagnoses:  Viral illness  COVID-19    Rx / DC Orders ED Discharge Orders          Ordered    ondansetron (ZOFRAN-ODT) 4 MG disintegrating tablet  Every 8 hours PRN        12/13/20 2010    diphenhydrAMINE (BENADRYL) 25 MG tablet  Every 8 hours PRN        12/13/20 2010             Pati Gallo [place], [place] 12/15/20 1354    Gareth Morgan, MD 12/17/20 2235

## 2021-01-14 ENCOUNTER — Other Ambulatory Visit (HOSPITAL_BASED_OUTPATIENT_CLINIC_OR_DEPARTMENT_OTHER): Payer: Self-pay

## 2021-01-27 ENCOUNTER — Ambulatory Visit (INDEPENDENT_AMBULATORY_CARE_PROVIDER_SITE_OTHER): Payer: 59 | Admitting: Family Medicine

## 2021-01-27 ENCOUNTER — Other Ambulatory Visit (HOSPITAL_COMMUNITY)
Admission: RE | Admit: 2021-01-27 | Discharge: 2021-01-27 | Disposition: A | Discharge status: Home / Self Care | Payer: 59 | Source: Ambulatory Visit | Attending: Family | Admitting: Family

## 2021-01-27 ENCOUNTER — Encounter: Payer: Self-pay | Admitting: Family Medicine

## 2021-01-27 ENCOUNTER — Other Ambulatory Visit (HOSPITAL_BASED_OUTPATIENT_CLINIC_OR_DEPARTMENT_OTHER): Payer: Self-pay

## 2021-01-27 VITALS — BP 108/70 | HR 93 | Temp 98.1°F | Resp 18 | Ht 72.0 in | Wt 197.2 lb

## 2021-01-27 DIAGNOSIS — Z202 Contact with and (suspected) exposure to infections with a predominantly sexual mode of transmission: Secondary | ICD-10-CM | POA: Diagnosis present

## 2021-01-27 MED ORDER — METRONIDAZOLE 500 MG PO TABS
2000.0000 mg | ORAL_TABLET | Freq: Once | ORAL | 0 refills | Status: AC
  Filled 2021-01-27: qty 4, 1d supply, fill #0

## 2021-01-27 NOTE — Progress Notes (Signed)
Acute Office Visit  Subjective:    Patient ID: Blake Baldwin, male    DOB: [birth year], 36 y.o.   MRN: [id number]  Chief Complaint  Patient presents with   Exposure to STD    Pt states partner has Trich. He states not showing any signs of STD    Exposure to STD  Patient is in today for STD screening.  Patient reports new male partners without using protection. He was told yesterday that a partner tested positive for trich. He would like empiric treatment and urine testing only; declines oral testing or blood work at this time. He denies any penile pain, lesions, discharge, fevers, rashes.     Past Medical History:  Diagnosis Date   Bronchitis    Gallstones     Past Surgical History:  Procedure Laterality Date   APPENDECTOMY     CHOLECYSTECTOMY      Family History  Problem Relation Age of Onset   Hypertension Mother    Other Mother        hyperaldosteronism and padgett's disease   Asthma Mother    Heart disease Maternal Aunt    Diabetes Maternal Aunt    Diabetes Maternal Uncle    Diabetes Cousin     Social History   Socioeconomic History   Marital status: Divorced    Spouse name: Not on file   Number of children: Not on file   Years of education: Not on file   Highest education level: Not on file  Occupational History   Not on file  Tobacco Use   Smoking status: Former    Packs/day: 0.50    Types: Cigarettes    Quit date: 11/25/2015    Years since quitting: 5.1   Smokeless tobacco: Former  Scientific laboratory technician Use: Never used  Substance and Sexual Activity   Alcohol use: Yes    Comment: occ   Drug use: No   Sexual activity: Not on file  Other Topics Concern   Not on file  Social History Narrative   In middle of divorce   Has 2 children   Works at [employer]   Social Determinants of Radio broadcast assistant Strain: Not on file  Food Insecurity: Not on file  Transportation Needs: Not on file  Physical Activity: Not on file  Stress:  Not on file  Social Connections: Not on file  Intimate Partner Violence: Not on file    Outpatient Medications Prior to Visit  Medication Sig Dispense Refill   omeprazole (PRILOSEC) 40 MG capsule Take 1 capsule (40 mg total) by mouth daily **Need appointment for further refills** 90 capsule 4   diphenhydrAMINE (BENADRYL) 25 MG tablet Take 1-2 tablets (25-50 mg total) by mouth every 8 (eight) hours as needed. (Patient not taking: Reported on 01/27/2021) 30 tablet 0   ondansetron (ZOFRAN-ODT) 4 MG disintegrating tablet Take 1 tablet (4 mg total) by mouth every 8 (eight) hours as needed for nausea or vomiting. (Patient not taking: Reported on 01/27/2021) 6 tablet 0   No facility-administered medications prior to visit.    Allergies  Allergen Reactions   Diclofenac     anxiety   Dilaudid [Hydromorphone Hcl] Itching and Rash    Review of Systems All review of systems negative except what is listed in the HPI     Objective:    Physical Exam Vitals reviewed.  Constitutional:      Appearance: Normal appearance.  Genitourinary:    Comments: deferred  Neurological:     General: No focal deficit present.     Mental Status: He is alert.  Psychiatric:        Mood and Affect: Mood normal.        Behavior: Behavior normal.        Thought Content: Thought content normal.        Judgment: Judgment normal.    There were no vitals taken for this visit. Wt Readings from Last 3 Encounters:  10/30/20 198 lb (89.8 kg)  06/15/20 199 lb 9.6 oz (90.5 kg)  05/31/19 193 lb (87.5 kg)    Health Maintenance Due  Topic Date Due   COVID-19 Vaccine (1) Never done   HIV Screening  Never done   Hepatitis C Screening  Never done    There are no preventive care reminders to display for this patient.   Lab Results  Component Value Date   TSH 0.77 11/08/2017   Lab Results  Component Value Date   WBC 5.7 11/08/2017   HGB 16.0 11/08/2017   HCT 45.3 11/08/2017   MCV 92.5 11/08/2017   PLT  164.0 11/08/2017   Lab Results  Component Value Date   NA 140 11/08/2017   K 4.0 11/08/2017   CO2 26 11/08/2017   GLUCOSE 89 11/08/2017   BUN 13 11/08/2017   CREATININE 0.81 11/08/2017   BILITOT 0.8 11/08/2017   ALKPHOS 63 11/08/2017   AST 11 11/08/2017   ALT 16 11/08/2017   PROT 7.3 11/08/2017   ALBUMIN 4.8 11/08/2017   CALCIUM 9.8 11/08/2017   ANIONGAP 8 05/25/2016   GFR 117.78 11/08/2017   Lab Results  Component Value Date   CHOL 161 01/18/2012   Lab Results  Component Value Date   HDL 34 (L) 01/18/2012   Lab Results  Component Value Date   LDLCALC 111 (H) 01/18/2012   Lab Results  Component Value Date   TRIG 82 01/18/2012   Lab Results  Component Value Date   CHOLHDL 4.7 01/18/2012   No results found for: HGBA1C     Assessment & Plan:   1. Exposure to trichomonas Urine testing only today per patient preference - will update him with results and any changes to plan.  Empiric antibiotics ordered - education provided.  Patient aware of signs/symptoms requiring further/urgent evaluation.  - Urine cytology ancillary only([place]) - metroNIDAZOLE (FLAGYL) 500 MG tablet; Take 4 tablets (2,000 mg total) by mouth once for 1 dose.  Dispense: 4 tablet; Refill: 0   Follow-up as needed.   Terrilyn Saver, NP

## 2021-01-27 NOTE — Patient Instructions (Signed)
Treating empirically for trich Checking urine for trich, gonorrhea, chlamydia - we will let you know results and any changes to plan  If you decide you'd like to add oral swabs or blood work, please let us know.

## 2021-01-28 LAB — URINE CYTOLOGY ANCILLARY ONLY
Chlamydia: NEGATIVE
Comment: NEGATIVE
Comment: NEGATIVE
Comment: NORMAL
Neisseria Gonorrhea: NEGATIVE
Trichomonas: NEGATIVE

## 2021-04-05 ENCOUNTER — Other Ambulatory Visit (HOSPITAL_BASED_OUTPATIENT_CLINIC_OR_DEPARTMENT_OTHER): Payer: Self-pay

## 2021-08-22 ENCOUNTER — Other Ambulatory Visit: Payer: Self-pay

## 2021-08-22 ENCOUNTER — Encounter (HOSPITAL_BASED_OUTPATIENT_CLINIC_OR_DEPARTMENT_OTHER): Payer: Self-pay | Admitting: Emergency Medicine

## 2021-08-22 ENCOUNTER — Emergency Department (HOSPITAL_BASED_OUTPATIENT_CLINIC_OR_DEPARTMENT_OTHER)
Admission: EM | Admit: 2021-08-22 | Discharge: 2021-08-22 | Disposition: A | Discharge status: Home / Self Care | Payer: 59 | Attending: Emergency Medicine | Admitting: Emergency Medicine

## 2021-08-22 DIAGNOSIS — W272XXA Contact with scissors, initial encounter: Secondary | ICD-10-CM | POA: Insufficient documentation

## 2021-08-22 DIAGNOSIS — Z23 Encounter for immunization: Secondary | ICD-10-CM | POA: Insufficient documentation

## 2021-08-22 DIAGNOSIS — S61211A Laceration without foreign body of left index finger without damage to nail, initial encounter: Secondary | ICD-10-CM | POA: Diagnosis not present

## 2021-08-22 DIAGNOSIS — Y9389 Activity, other specified: Secondary | ICD-10-CM | POA: Insufficient documentation

## 2021-08-22 DIAGNOSIS — S6992XA Unspecified injury of left wrist, hand and finger(s), initial encounter: Secondary | ICD-10-CM | POA: Diagnosis present

## 2021-08-22 MED ORDER — BACITRACIN ZINC 500 UNIT/GM EX OINT
TOPICAL_OINTMENT | Freq: Two times a day (BID) | CUTANEOUS | Status: DC
  Filled 2021-08-22: qty 28.35

## 2021-08-22 MED ORDER — LIDOCAINE HCL (PF) 1 % IJ SOLN
5.0000 mL | Freq: Once | INTRAMUSCULAR | Status: AC
  Administered 2021-08-22: 5 mL
  Filled 2021-08-22: qty 5

## 2021-08-22 MED ORDER — TETANUS-DIPHTH-ACELL PERTUSSIS 5-2.5-18.5 LF-MCG/0.5 IM SUSY
0.5000 mL | PREFILLED_SYRINGE | Freq: Once | INTRAMUSCULAR | Status: AC
  Administered 2021-08-22: 0.5 mL via INTRAMUSCULAR
  Filled 2021-08-22: qty 0.5

## 2021-08-22 NOTE — ED Triage Notes (Signed)
Pt accidentally cut L index finger this morning when trying to cut open packaging on ant trap. 1cm laceration with no bleeding at present. Unsure of last tetanus shot.

## 2021-08-22 NOTE — ED Provider Notes (Signed)
[facility] EMERGENCY DEPARTMENT Provider Note   CSN: [id number] Arrival date & time: 08/22/21  8546     History Chief Complaint  Patient presents with   Finger Injury    Blake Baldwin is a 36 y.o. male otherwise unremarkable presents the emergency department for evaluation of laceration to his left index finger this morning.  Patient reports that he was trying to cut open a package with scissors when he cut through the more proximal part of his index finger.  Does not know when his last tetanus was.  Denies any numbness or tingling. NKDA.  HPI     Home Medications Prior to Admission medications   Medication Sig Start Date End Date Taking? Authorizing Provider  diphenhydrAMINE (BENADRYL) 25 MG tablet Take 1-2 tablets (25-50 mg total) by mouth every 8 (eight) hours as needed. Patient not taking: Reported on 01/27/2021 12/13/20   Tedd Sias, PA  omeprazole (PRILOSEC) 40 MG capsule Take 1 capsule (40 mg total) by mouth daily **Need appointment for further refills** 10/30/20   [name], NP  ondansetron (ZOFRAN-ODT) 4 MG disintegrating tablet Take 1 tablet (4 mg total) by mouth every 8 (eight) hours as needed for nausea or vomiting. Patient not taking: Reported on 01/27/2021 12/13/20   Tedd Sias, PA      Allergies    Diclofenac and Dilaudid [hydromorphone hcl]    Review of Systems   Review of Systems  Skin:  Positive for wound.  Neurological:  Negative for numbness.    Physical Exam Updated Vital Signs BP (!) 131/96   Pulse 78   Temp 97.7 F (36.5 C) (Oral)   Resp 16   SpO2 99%  Physical Exam Vitals and nursing note reviewed.  Constitutional:      Appearance: Normal appearance.  Eyes:     General: No scleral icterus. Pulmonary:     Effort: Pulmonary effort is normal. No respiratory distress.  Skin:    General: Skin is dry.     Findings: No rash.     Comments: Approximately 1 cm laceration noted to the proximal phalanx of the left  index finger.  Cap refill intact.  Compartments are soft.  Full range of motion.  Radial pulses intact. Sensation intact.   Neurological:     General: No focal deficit present.     Mental Status: He is alert. Mental status is at baseline.  Psychiatric:        Mood and Affect: Mood normal.     ED Results / Procedures / Treatments   Labs (all labs ordered are listed, but only abnormal results are displayed) Labs Reviewed - No data to display  EKG None  Radiology No results found.  Procedures .Marland KitchenLaceration Repair  Date/Time: 08/22/2021 10:58 AM  Performed by: Sherrell Puller, PA-C Authorized by: Sherrell Puller, PA-C   Consent:    Consent obtained:  Verbal   Consent given by:  Patient   Risks discussed:  Infection, need for additional repair, pain and nerve damage   Alternatives discussed:  No treatment and delayed treatment Universal protocol:    Procedure explained and questions answered to patient or proxy's satisfaction: yes     Patient identity confirmed:  Verbally with patient Anesthesia:    Anesthesia method:  Nerve block   Block injection procedure:  Anatomic landmarks identified, anatomic landmarks palpated and introduced needle   Block outcome:  Anesthesia achieved Laceration details:    Location:  Finger   Finger location:  L index  finger   Length (cm):  1.5 Pre-procedure details:    Preparation:  Patient was prepped and draped in usual sterile fashion Exploration:    Wound exploration: wound explored through full range of motion and entire depth of wound visualized     Contaminated: no   Treatment:    Area cleansed with:  Shur-Clens   Amount of cleaning:  Standard   Irrigation solution:  Sterile saline   Irrigation volume:  75m   Irrigation method:  Syringe Skin repair:    Repair method:  Sutures   Suture size:  4-0   Suture material:  Prolene   Suture technique:  Simple interrupted   Number of sutures:  3 Approximation:    Approximation:   Close Repair type:    Repair type:  Simple Post-procedure details:    Dressing:  Non-adherent dressing and antibiotic ointment   Procedure completion:  Tolerated well, no immediate complications    Medications Ordered in ED Medications  lidocaine (PF) (XYLOCAINE) 1 % injection 5 mL (has no administration in time range)  Tdap (BOOSTRIX) injection 0.5 mL (has no administration in time range)    ED Course/ Medical Decision Making/ A&P                           Medical Decision Making Risk Prescription drug management.   36year old male presents emerged from for evaluation of finger laceration.  It was with scissors he denies any foreign body retention.  Tetanus not up-to-date.  Vital signs show mild hypertension otherwise normal.  Please see physical exam above.    Will suture.  Tetanus updated.  Please see procedure note. Patient tolerated the procedure well. Resident MNani Gasser MD assisted with suturing.   Wound did not appear to be contaminated. The area was thoroughly cleansed and irrigated. Sutures were placed. Nonadherent bandage with bacitracin ointment placed. We discussed laceration care and to return in 7 days to his PCP, UC, or the ER for suture removal. We discussed strict return precautions and red flag symptoms. The patient is stable and being discharged home in good condition.   Final Clinical Impression(s) / ED Diagnoses Final diagnoses:  Laceration of left index finger without foreign body without damage to nail, initial encounter    Rx / DC Orders ED Discharge Orders     None         RSherrell Puller PA-C 08/22/21 1110    PBlanchie Dessert MD 08/22/21 1521

## 2021-08-22 NOTE — Discharge Instructions (Addendum)
You were seen here in the ER for evaluation of your laceration on your finger. We were able to repair this with stitches for you. Please keep this are clean twice daily with Dial soap and water. Please keep it covered for the next 2 days with a bandage. Make sure you are changing the bandage daily or when visibly soiled. You will need to return to the ED, urgent care, or your primary care office for the removal of your stitches. If you have any concern, new or worsening symptoms, please return to the nearest ER for re-evaluation.   Contact a doctor if: You got a tetanus shot and you have any of these problems where the needle went in: Swelling. Very bad pain. Redness. Bleeding. A wound that was closed breaks open. You have a fever. You have any of these signs of infection in your wound: More redness, swelling, or pain. Fluid or blood. Warmth. Pus or a bad smell. You see something coming out of the wound, such as wood or glass. Medicine does not make your pain go away. You notice a change in the color of your skin near your wound. You need to change the bandage often. You have a new rash. You lose feeling (have numbness) around the wound. Get help right away if: You have very bad swelling around the wound. Your pain suddenly gets worse and is very bad. You have painful lumps near the wound or on skin anywhere on your body. You have a red streak going away from your wound. The wound is on your hand or foot, and: You cannot move a finger or toe.

## 2021-08-29 ENCOUNTER — Encounter (HOSPITAL_BASED_OUTPATIENT_CLINIC_OR_DEPARTMENT_OTHER): Payer: Self-pay | Admitting: Emergency Medicine

## 2021-08-29 ENCOUNTER — Other Ambulatory Visit: Payer: Self-pay

## 2021-08-29 ENCOUNTER — Emergency Department (HOSPITAL_BASED_OUTPATIENT_CLINIC_OR_DEPARTMENT_OTHER)
Admission: EM | Admit: 2021-08-29 | Discharge: 2021-08-29 | Disposition: A | Discharge status: Home / Self Care | Payer: 59 | Attending: Emergency Medicine | Admitting: Emergency Medicine

## 2021-08-29 DIAGNOSIS — S61211D Laceration without foreign body of left index finger without damage to nail, subsequent encounter: Secondary | ICD-10-CM | POA: Insufficient documentation

## 2021-08-29 DIAGNOSIS — Z4802 Encounter for removal of sutures: Secondary | ICD-10-CM | POA: Diagnosis not present

## 2021-08-29 DIAGNOSIS — L03012 Cellulitis of left finger: Secondary | ICD-10-CM

## 2021-08-29 DIAGNOSIS — W268XXD Contact with other sharp object(s), not elsewhere classified, subsequent encounter: Secondary | ICD-10-CM | POA: Insufficient documentation

## 2021-08-29 MED ORDER — SULFAMETHOXAZOLE-TRIMETHOPRIM 800-160 MG PO TABS: 1.0000 | ORAL_TABLET | Freq: Two times a day (BID) | ORAL | 0 refills | Status: AC

## 2021-08-29 NOTE — ED Provider Notes (Signed)
[facility] EMERGENCY DEPARTMENT Provider Note   CSN: [id number] Arrival date & time: 08/29/21  0944     History  Chief Complaint  Patient presents with   Suture / Staple Removal    Blake Baldwin is a 36 y.o. male who presents the emergency department for suture removal.  Patient had 3 sutures placed in his left index finger on 8/13 after cutting it with scissors.  States that the area has continued to be tender and irritated, feels slightly worse than it did right after he got the stitches.   Suture / Staple Removal       Home Medications Prior to Admission medications   Medication Sig Start Date End Date Taking? Authorizing Provider  sulfamethoxazole-trimethoprim (BACTRIM DS) 800-160 MG tablet Take 1 tablet by mouth 2 (two) times daily for 5 days. 08/29/21 09/03/21 Yes Calea Hribar T, PA-C  diphenhydrAMINE (BENADRYL) 25 MG tablet Take 1-2 tablets (25-50 mg total) by mouth every 8 (eight) hours as needed. Patient not taking: Reported on 01/27/2021 12/13/20   Tedd Sias, PA  omeprazole (PRILOSEC) 40 MG capsule Take 1 capsule (40 mg total) by mouth daily **Need appointment for further refills** 10/30/20   [name], NP  ondansetron (ZOFRAN-ODT) 4 MG disintegrating tablet Take 1 tablet (4 mg total) by mouth every 8 (eight) hours as needed for nausea or vomiting. Patient not taking: Reported on 01/27/2021 12/13/20   Tedd Sias, PA      Allergies    Diclofenac and Dilaudid [hydromorphone hcl]    Review of Systems   Review of Systems  Skin:  Positive for wound.  All other systems reviewed and are negative.   Physical Exam Updated Vital Signs BP 136/87   Pulse 86   Temp 98 F (36.7 C) (Oral)   Resp 18   Ht 6' (1.829 m)   Wt 90 kg   SpO2 97%   BMI 26.91 kg/m  Physical Exam Vitals and nursing note reviewed.  Constitutional:      Appearance: Normal appearance.  HENT:     Head: Normocephalic and atraumatic.  Eyes:      Conjunctiva/sclera: Conjunctivae normal.  Pulmonary:     Effort: Pulmonary effort is normal. No respiratory distress.  Skin:    General: Skin is warm and dry.     Comments: Actually healed 1 cm linear laceration to the palmar aspect of the left index finger with some increased erythema and tenderness, 3 sutures in place  Neurological:     Mental Status: He is alert.  Psychiatric:        Mood and Affect: Mood normal.        Behavior: Behavior normal.     ED Results / Procedures / Treatments   Labs (all labs ordered are listed, but only abnormal results are displayed) Labs Reviewed - No data to display  EKG None  Radiology No results found.  Procedures .Suture Removal  Date/Time: 08/29/2021 10:16 AM  Performed by: Kateri Plummer, PA-C Authorized by: Kateri Plummer, PA-C   Consent:    Consent obtained:  Verbal   Consent given by:  Patient   Risks, benefits, and alternatives were discussed: yes     Risks discussed:  Bleeding, pain and wound separation Universal protocol:    Patient identity confirmed:  Provided demographic data Location:    Location:  Upper extremity   Upper extremity location:  Hand   Hand location:  L index finger Procedure details:  Wound appearance:  Tender and red   Number of sutures removed:  3 Post-procedure details:    Post-removal:  No dressing applied   Procedure completion:  Tolerated well, no immediate complications     Medications Ordered in ED Medications - No data to display  ED Course/ Medical Decision Making/ A&P                           Medical Decision Making Risk Prescription drug management.   Patient is otherwise healthy 36 year old male who presents the emergency department for suture removal.  He had 3 sutures placed in his left index finger on 8/13.  On exam patient has 3 sutures in place on that finger.  There is some localized erythema and tenderness, without purulence.  I am concerned for  cellulitis.  Sutures removed and patient tolerated procedure well, there was no wound separation.  Will discharge home with course of antibiotics and recommend continued wound care.  Patient discharged in stable condition and all questions answered.        Final Clinical Impression(s) / ED Diagnoses Final diagnoses:  Visit for suture removal  Cellulitis of left finger    Rx / DC Orders ED Discharge Orders          Ordered    sulfamethoxazole-trimethoprim (BACTRIM DS) 800-160 MG tablet  2 times daily        08/29/21 1010           Portions of this report may have been transcribed using voice recognition software. Every effort was made to ensure accuracy; however, inadvertent computerized transcription errors may be present.    Estill Cotta 08/29/21 1017    Gareth Morgan, MD 08/29/21 2236

## 2021-08-29 NOTE — ED Triage Notes (Signed)
Pt arrives pov for suture removal from RT index finger.

## 2021-08-29 NOTE — Discharge Instructions (Addendum)
You were seen in the emergency department today for suture removal.  As we discussed the area around your sutures does look red and irritated, and I am concerned that it could be getting infected.  I would like to place you on some antibiotics.  Try to keep the area clean and you can continue using antibiotic ointment.

## 2021-09-21 ENCOUNTER — Ambulatory Visit (INDEPENDENT_AMBULATORY_CARE_PROVIDER_SITE_OTHER): Payer: 59 | Admitting: Family

## 2021-09-21 ENCOUNTER — Other Ambulatory Visit (HOSPITAL_BASED_OUTPATIENT_CLINIC_OR_DEPARTMENT_OTHER): Payer: Self-pay

## 2021-09-21 VITALS — BP 138/81 | HR 82 | Temp 97.8°F | Resp 16 | Wt 214.0 lb

## 2021-09-21 DIAGNOSIS — M545 Low back pain, unspecified: Secondary | ICD-10-CM

## 2021-09-21 MED ORDER — MELOXICAM 7.5 MG PO TABS
7.5000 mg | ORAL_TABLET | Freq: Every day | ORAL | 0 refills | Status: DC
  Filled 2021-09-21: qty 14, 14d supply, fill #0

## 2021-09-21 NOTE — Progress Notes (Addendum)
Subjective:   By signing my name below, I, Blake Baldwin, attest that this documentation has been prepared under the direction and in the presence of [place], NP 09/21/2021     Patient ID: Blake Baldwin, male    DOB: 06-11-1985, 36 y.o.   MRN: [id number]  Chief Complaint  Patient presents with   Back Pain    Complains of low back pain, on and off since injury in March.     HPI Patient is in today for an office visit  Back Pain: He complains of back pain that begun on 03/23. He reports an incident at work where his stepped on one side of a fork and he fell backwards on the other side of a fork. He was sent to a facility at work but reports that he does not seem beneficial. His back pain did go away for a brief period of time but returned about two months ago. Pain radiates around his lower back. He currently uses Ibuprofen and Muscle Relaxers. He has tried Biofreeze and heating pad. His pain occasioanlly radiates down his right leg. He also notes that his right hip feels abnormal as well. Pain occasionally becomes a 6/10 when he is standing.   Health Maintenance Due  Topic Date Due   COVID-19 Vaccine (1) Never done   HIV Screening  Never done   Hepatitis C Screening  Never done   INFLUENZA VACCINE  Never done    Past Medical History:  Diagnosis Date   Bronchitis    Gallstones     Past Surgical History:  Procedure Laterality Date   APPENDECTOMY     CHOLECYSTECTOMY      Family History  Problem Relation Age of Onset   Hypertension Mother    Other Mother        hyperaldosteronism and padgett's disease   Asthma Mother    Heart disease Maternal Aunt    Diabetes Maternal Aunt    Diabetes Maternal Uncle    Diabetes Cousin     Social History   Socioeconomic History   Marital status: Divorced    Spouse name: Not on file   Number of children: Not on file   Years of education: Not on file   Highest education level: Not on file  Occupational History    Not on file  Tobacco Use   Smoking status: Former    Packs/day: 0.50    Types: Cigarettes    Quit date: 11/25/2015    Years since quitting: 5.8   Smokeless tobacco: Former  Scientific laboratory technician Use: Never used  Substance and Sexual Activity   Alcohol use: Yes    Comment: occ   Drug use: No   Sexual activity: Not on file  Other Topics Concern   Not on file  Social History Narrative   In middle of divorce   Has 2 children   Works at [employer] ex   Social Determinants of Radio broadcast assistant Strain: Not on file  Food Insecurity: Not on file  Transportation Needs: Not on file  Physical Activity: Not on file  Stress: Not on file  Social Connections: Not on file  Intimate Partner Violence: Not on file    Outpatient Medications Prior to Visit  Medication Sig Dispense Refill   ibuprofen (ADVIL) 800 MG tablet Take 800 mg by mouth every 8 (eight) hours as needed.     omeprazole (PRILOSEC) 40 MG capsule Take 1 capsule (40 mg total)  by mouth daily **Need appointment for further refills** 90 capsule 4   diphenhydrAMINE (BENADRYL) 25 MG tablet Take 1-2 tablets (25-50 mg total) by mouth every 8 (eight) hours as needed. 30 tablet 0   ondansetron (ZOFRAN-ODT) 4 MG disintegrating tablet Take 1 tablet (4 mg total) by mouth every 8 (eight) hours as needed for nausea or vomiting. 6 tablet 0   No facility-administered medications prior to visit.    Allergies  Allergen Reactions   Diclofenac     anxiety   Dilaudid [Hydromorphone Hcl] Itching and Rash    Review of Systems  Musculoskeletal:  Positive for back pain ((Occasionally) Radiates Down Right Leg).       Objective:    Physical Exam Constitutional:      General: He is not in acute distress.    Appearance: Normal appearance. He is not ill-appearing.  HENT:     Head: Normocephalic and atraumatic.     Right Ear: External ear normal.     Left Ear: External ear normal.  Eyes:     Extraocular Movements: Extraocular movements  intact.     Pupils: Pupils are equal, round, and reactive to light.  Cardiovascular:     Rate and Rhythm: Normal rate and regular rhythm.     Heart sounds: Normal heart sounds. No murmur heard.    No gallop.  Pulmonary:     Effort: Pulmonary effort is normal. No respiratory distress.     Breath sounds: Normal breath sounds. No wheezing or rales.  Musculoskeletal:     Thoracic back: Normal. No tenderness.     Lumbar back: Normal. No tenderness.     Comments:  5/5 strength upper and lower extremities Negative right straight leg raise.  Bilateral LE strength is 5/5  Skin:    General: Skin is warm and dry.  Neurological:     Mental Status: He is alert and oriented to person, place, and time.     Deep Tendon Reflexes:     Reflex Scores:      Patellar reflexes are 2+ on the right side and 2+ on the left side. Psychiatric:        Mood and Affect: Mood normal.        Behavior: Behavior normal.        Judgment: Judgment normal.     BP 138/81 (BP Location: Right Arm, Patient Position: Sitting, Cuff Size: Large)   Pulse 82   Temp 97.8 F (36.6 C) (Oral)   Resp 16   Wt 214 lb (97.1 kg)   SpO2 98%   BMI 29.02 kg/m  Wt Readings from Last 3 Encounters:  09/21/21 214 lb (97.1 kg)  08/29/21 198 lb 6.6 oz (90 kg)  01/27/21 197 lb 3.2 oz (89.4 kg)       Assessment & Plan:   Problem List Items Addressed This Visit       Unprioritized   Low back pain - Primary    New. Will rx with meloxicam and refer to physical therapy.  He is worried about missing work to go to PT.  I recommended that he get FMLA paperwork for [place] to fill out for him.  He is advised to follow up if his pain does not improve with PT.       Relevant Medications   ibuprofen (ADVIL) 800 MG tablet   meloxicam (MOBIC) 7.5 MG tablet   Other Relevant Orders   Ambulatory referral to Physical Therapy   Meds ordered this encounter  Medications  meloxicam (MOBIC) 7.5 MG tablet    Sig: Take 1 tablet (7.5 mg total) by  mouth daily.    Dispense:  14 tablet    Refill:  0    Order Specific Question:   Supervising Provider    Answer:   Penni Homans A [[id number]]    I, Nance Pear, NP, personally preformed the services described in this documentation.  All medical record entries made by the scribe were at my direction and in my presence.  I have reviewed the chart and discharge instructions (if applicable) and agree that the record reflects my personal performance and is accurate and complete. 09/21/2021   I,Amber Santillo,acting as a Education administrator for Nance Pear, NP.,have documented all relevant documentation on the behalf of Nance Pear, NP,as directed by  Nance Pear, NP while in the presence of Nance Pear, NP.    Nance Pear, NP

## 2021-09-21 NOTE — Assessment & Plan Note (Signed)
New. Will rx with meloxicam and refer to physical therapy.  He is worried about missing work to go to PT.  I recommended that he get FMLA paperwork for [place] to fill out for him.  He is advised to follow up if his pain does not improve with PT.

## 2021-10-05 NOTE — Therapy (Addendum)
OUTPATIENT PHYSICAL THERAPY THORACOLUMBAR EVALUATION   Patient Name: Blake Baldwin MRN: [id number] DOB:[birth year], 36 y.o., male Today's Date: 10/06/2021   PT End of Session - 10/06/21 1100     Visit Number 1    Number of Visits 12    Date for PT Re-Evaluation 11/17/21    Authorization Type [employer]    PT Start Time 1105    PT Stop Time 1153    PT Time Calculation (min) 48 min    Activity Tolerance Patient tolerated treatment well    Behavior During Therapy Blake Baldwin for tasks assessed/performed             Past Medical History:  Diagnosis Date   Bronchitis    Gallstones    Past Surgical History:  Procedure Laterality Date   APPENDECTOMY     CHOLECYSTECTOMY     Patient Active Problem List   Diagnosis Date Noted   Low back pain 09/21/2021   Musculoskeletal neck pain 06/07/2013   Lipoma of right shoulder 05/08/2013   GERD (gastroesophageal reflux disease) 06/22/2012   Routine general medical examination at a health care facility 01/18/2012   Nevus of eye 01/18/2012   Thrombocytopenia ([place]) 01/18/2012   History of tobacco abuse 01/18/2012    PCP: Blake Alar, NP  REFERRING PROVIDER: [name], NP  REFERRING DIAG: M54.50 (ICD-10-CM) - Low back pain, unspecified back pain laterality, unspecified chronicity, unspecified whether sciatica present  Rationale for Evaluation and Treatment Rehabilitation  THERAPY DIAG:  Other low back pain  Cramp and spasm  Abnormal posture  Muscle weakness (generalized)  ONSET DATE: March  SUBJECTIVE:                                                                                                                                                                                           SUBJECTIVE STATEMENT: Patient had left a pallet jack in a blind spot at the bottom of a ladder. He lost his balance and fell on the fork of the lift hitting his right low back, but his pain is more left sided. It has progressively  worsened. When he's active it tends to go away, but when standing still it increases. Limited to 20 -30 min standing. Sitting is not bad. He'll have a dull ache. Can pick up boxes pretty well but the upright posture bothers him. He reports that he has right sciatica in the past. He does report intermittent numbness feeling in bil legs.    PERTINENT HISTORY:  Gall bladder and appendix removed  PAIN:  Are you having pain? Yes: NPRS scale: 1 today to 7/10 Pain location: left lumbar Pain description:  pressure, like sticking an elbow in his back Aggravating factors: prolonged standing Relieving factors: sitting, bend over, walking   PRECAUTIONS: None  WEIGHT BEARING RESTRICTIONS No  FALLS:  Has patient fallen in last 6 months? Yes. 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   PLOF: Independent  PATIENT GOALS Relieve the pain   OBJECTIVE:   DIAGNOSTIC FINDINGS:  XR negative per pt  PATIENT SURVEYS:  Modified Oswestry 14% disability   SCREENING FOR RED FLAGS: Bowel or bladder incontinence: No Spinal tumors: No Cauda equina syndrome: No Compression fracture: No Abdominal aneurysm: No  COGNITION:  Overall cognitive status: Within functional limits for tasks assessed     SENSATION: WFL  MUSCLE LENGTH: [id number] bil Quads:WNL ITB:WNL Piriformis:marked bil Hip Flexors: WNL Heelcords: bil tightness   POSTURE: rounded shoulders, forward head, and left lateral shift of shoulders  PALPATION: TTP: increased tone and tenderness right glut min/med, bil lumbar/QL  UPA left lumbar: pain at L5/S1 and L2/3, L1/2, R L4/5, L3/4, L2/3 spasm response, CPA mild discomfort throughout   LUMBAR ROM:    Active  A/PROM  eval  Flexion Hands to mid shin*  Extension full  Right lateral flexion Full but tight on left  Left lateral flexion fulll  Right rotation full  Left rotation full   (Blank rows = not tested)  LOWER EXTREMITY ROM:     WNL  LOWER EXTREMITY MMT:    MMT Right eval Left eval  Hip flexion 5 5  Hip extension 5 5  Hip abduction 5 5  Hip adduction 5 5  Knee flexion 5 5  Knee extension 5 5  Ankle dorsiflexion 5 5   (Blank rows = not tested)  LUMBAR SPECIAL TESTS:  Straight leg raise test: Negative, Slump test: Negative, and FABER test: Negative    TODAY'S TREATMENT  TPR left QL in SDLY Therex: see HEP   PATIENT EDUCATION:  Education details: PT eval findings, anticipated POC, initial HEP, postural awareness, DN rational, procedure, outcomes, potential side effects, and recommended post-treatment exercises/activity, and  use of lumbar support in sitting  Person educated: Patient Education method: Explanation, Demonstration, Verbal cues, and Handouts Education comprehension: verbalized understanding and returned demonstration   HOME EXERCISE PROGRAM: Access Code: [email or web address] URL: [email or web address] Date: 10/06/2021 Prepared by: Almyra Free  Exercises - Right Standing Lateral Shift Correction at Wall - Repetitions  - 2 x daily - 7 x weekly - 1 sets - 5 reps - 5-10 sec hold - Seated Hamstring Stretch  - 2 x daily - 7 x weekly - 1 sets - 3 reps - 30-60 sec hold - Seated Piriformis Stretch with Trunk Bend  - 2 x daily - 7 x weekly - 1 sets - 3 reps - 30-60 sec  hold - Gastroc Stretch on Wall  - 2 x daily - 7 x weekly - 1 sets - 3 reps - 30-60 sec hold  ASSESSMENT:  CLINICAL IMPRESSION: Blake Baldwin is a 36 y.o. male who was seen today for physical therapy evaluation and treatment for low back pain. 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 Pain is worse with transitional movements  and standing affecting ADLS and work requirements.  Blake Baldwin has deficits in lumbar flexion,  flexibility, core strength and has postural deficits including a lateral shift.  He has decreased spinal mobility at levels L2 through S1 bil with pain and spasm noted during assessment. Patient will benefit from skilled PT to address these deficits.     OBJECTIVE IMPAIRMENTS decreased activity tolerance, decreased ROM, decreased strength, hypomobility, increased muscle spasms, impaired flexibility, improper body mechanics, postural dysfunction, and pain.   ACTIVITY LIMITATIONS lifting, sitting, standing, and transfers  PARTICIPATION LIMITATIONS: occupation requires frequent position changes  PERSONAL FACTORS  N/A    REHAB POTENTIAL: Excellent  CLINICAL DECISION MAKING: Stable/uncomplicated  EVALUATION COMPLEXITY: Low   GOALS: Goals reviewed with patient? Yes  SHORT TERM GOALS: Target date: 10/20/2021 (Remove Blue Hyperlink)  Patient will be independent with initial HEP.  Baseline:  Goal status: INITIAL    LONG TERM GOALS: Target date: 11/17/2021  (Remove Blue Hyperlink)  Patient will be independent with advanced/ongoing HEP to improve outcomes and carryover.  Baseline:  Goal status: INITIAL  2.  Patient will report 75% improvement in low back pain to improve QOL.  Baseline:  Goal status: INITIAL  3.  Patient will demonstrate full pain free lumbar ROM to perform ADLs.   Baseline:  Goal status: INITIAL  4.  Patient will demonstrate improved core strength as demonstrated by solid trunk stabilization with MMT of hip flexors and ability to perform high level core exercises such as a plank. Baseline:  Goal status: INITIAL  5.  Patient will report <= to 8% disabiity on the modified Oswestry to demonstrate improved functional ability.  Baseline:  Goal status: INITIAL   6.  Patient will tolerate standing for >= 1 hour without increased pain in order to perform ADLS and his job  requirements. Baseline:  Goal status: INITIAL  7.  Patient to demonstrate ability to achieve and maintain good spinal alignment/posturing and body mechanics needed for daily activities. Baseline: lateral shift Goal status: INITIAL  8. Patient will demonstrate correct form with dead lifts and squats in order to progress functional strengthening in a gym setting. Baseline:  Goal status: INITIAL    PLAN: PT FREQUENCY: 2x/week  PT DURATION: 2 weeks  PLANNED INTERVENTIONS: Therapeutic exercises, Therapeutic activity, Neuromuscular re-education, Patient/Family education, Self Care, Joint mobilization, Dry Needling, Electrical stimulation, Spinal mobilization, Cryotherapy, Moist heat, Taping, Traction, Ionotophoresis '4mg'$ /ml Dexamethasone, and Manual therapy.  PLAN FOR NEXT SESSION: Review and progress HEP, assess lateral shift, manual/DN to bil lumbar/QL and Rt glutes, ROM, core strengthening, spinal mobs prn, body mechanics and ADL modifications    Donyetta Ogletree, PT 10/06/2021, 1:04 PM

## 2021-10-06 ENCOUNTER — Encounter: Payer: Self-pay | Admitting: Physical Therapy

## 2021-10-06 ENCOUNTER — Ambulatory Visit: Payer: 59 | Attending: Family | Admitting: Physical Therapy

## 2021-10-06 ENCOUNTER — Other Ambulatory Visit: Payer: Self-pay

## 2021-10-06 DIAGNOSIS — R252 Cramp and spasm: Secondary | ICD-10-CM | POA: Diagnosis present

## 2021-10-06 DIAGNOSIS — M5459 Other low back pain: Secondary | ICD-10-CM | POA: Diagnosis present

## 2021-10-06 DIAGNOSIS — M6281 Muscle weakness (generalized): Secondary | ICD-10-CM | POA: Diagnosis present

## 2021-10-06 DIAGNOSIS — M545 Low back pain, unspecified: Secondary | ICD-10-CM | POA: Insufficient documentation

## 2021-10-06 DIAGNOSIS — R293 Abnormal posture: Secondary | ICD-10-CM | POA: Insufficient documentation

## 2021-10-07 NOTE — Therapy (Signed)
OUTPATIENT PHYSICAL THERAPY TREATMENT   Patient Name: Blake Baldwin MRN: [id number] DOB:10/31/1985, 36 y.o., male Today's Date: 10/11/2021   PT End of Session - 10/11/21 1100     Visit Number 2    Number of Visits 12    Date for PT Re-Evaluation 11/17/21    Authorization Type [employer]    PT Start Time 1100    PT Stop Time 1150    PT Time Calculation (min) 50 min    Activity Tolerance Patient tolerated treatment well    Behavior During Therapy Blake Baldwin for tasks assessed/performed              Past Medical History:  Diagnosis Date   Bronchitis    Gallstones    Past Surgical History:  Procedure Laterality Date   APPENDECTOMY     CHOLECYSTECTOMY     Patient Active Problem List   Diagnosis Date Noted   Low back pain 09/21/2021   Musculoskeletal neck pain 06/07/2013   Lipoma of right shoulder 05/08/2013   GERD (gastroesophageal reflux disease) 06/22/2012   Routine general medical examination at a health care facility 01/18/2012   Nevus of eye 01/18/2012   Thrombocytopenia ([place]) 01/18/2012   History of tobacco abuse 01/18/2012    PCP: Blake Alar, NP  REFERRING PROVIDER: [name], NP  REFERRING DIAG: M54.50 (ICD-10-CM) - Low back pain, unspecified back pain laterality, unspecified chronicity, unspecified whether sciatica present  Rationale for Evaluation and Treatment Rehabilitation  THERAPY DIAG:  Other low back pain  Cramp and spasm  Abnormal posture  Muscle weakness (generalized)  ONSET DATE: March  SUBJECTIVE:                                                                                                                                                                                           SUBJECTIVE STATEMENT: Still reporting ongoing low back pain. Pain still shifting left and right.   PERTINENT HISTORY:  Gall bladder and appendix removed  PAIN:  Are you having pain? Yes: NPRS scale: 0/10 Pain location: left lumbar Pain  description: pressure, like sticking an elbow in his back Aggravating factors: prolonged standing Relieving factors: sitting, bend over, walking   PRECAUTIONS: None  WEIGHT BEARING RESTRICTIONS No  FALLS:  Has patient fallen in last 6 months? Yes. 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   PLOF: Independent  PATIENT GOALS Relieve the pain   OBJECTIVE:   DIAGNOSTIC FINDINGS:  XR negative per pt  PATIENT SURVEYS:  Modified Oswestry 14% disability   SCREENING FOR RED FLAGS: Bowel or bladder incontinence: No Spinal tumors: No Cauda equina syndrome: No Compression fracture: No Abdominal aneurysm: No  COGNITION:  Overall cognitive status: Within functional limits for tasks assessed     SENSATION: WFL  MUSCLE LENGTH: FH:[id number] bil Quads:WNL ITB:WNL Piriformis:marked bil Hip Flexors: WNL Heelcords: bil tightness   POSTURE: rounded shoulders, forward head, and left lateral shift of shoulders  10/11/21 No lateral shift presents Tight lats R > L tested in hooklying)  PALPATION: TTP: increased tone and tenderness right glut min/med, bil lumbar/QL  UPA left lumbar: pain at L5/S1 and L2/3, L1/2, R L4/5, L3/4, L2/3 spasm response, CPA mild discomfort throughout   LUMBAR ROM:   Active  A/PROM  eval  Flexion Hands to mid shin*  Extension full  Right lateral flexion Full but tight on left  Left lateral flexion fulll  Right rotation full  Left rotation full   (Blank rows = not tested)  LOWER EXTREMITY ROM:     WNL  LOWER EXTREMITY MMT:    MMT Right eval Left eval  Hip flexion 5 5  Hip extension 5 5  Hip abduction 5 5  Hip adduction 5 5  Knee flexion 5 5  Knee extension 5 5   Ankle dorsiflexion 5 5   (Blank rows = not tested)  LUMBAR SPECIAL TESTS:  Straight leg raise test: Negative, Slump test: Negative, and FABER test: Negative    TODAY'S TREATMENT   10/11/21 Rec bike L 3 x 5 min Seated fig 4  x 1 min bil Seated HS stretch x 1 min bil Dynamic HS x 10 ea Plank 3 x 10 sec (less than 10 sec on reps 2 and 3) Side planks 2x10 sec ea Prone press ups x 10 Child's pose and then right and left 30 sec  Supine OH flexion to assess lats (bil tightness R>L)  Self Care: MFR using large foam roller in supine for lumbar/thoracic and gluteals. Standing demonstrated as well.  Standing MFR with ball  Manual therapy: Deep MFR and STM to bil lumbar/QL UPA mobs bil lumbar   10/06/21 TPR left QL in SDLY Therex: see HEP   PATIENT EDUCATION:  Education details: HEP update and self-STM techniques to lumbar and hips using ball and large foam roller  Person educated: Patient Education method: Explanation, Demonstration, Verbal cues, and Handouts Education comprehension: verbalized understanding and returned demonstration   HOME EXERCISE PROGRAM: Access Code: [email or web address] URL: [email or web address] Date: 10/11/2021 Prepared by: Blake Baldwin  Exercises - Right Standing Lateral Shift Correction at Bend  - 2 x daily - 7 x weekly - 1 sets - 5 reps - 5-10 sec hold - Seated Hamstring Stretch  - 2 x daily - 7 x weekly - 1 sets - 3 reps - 30-60 sec hold - Seated Piriformis Stretch with Trunk Bend  - 2 x daily - 7 x weekly - 1 sets - 3 reps - 30-60 sec  hold - Gastroc Stretch on Wall  - 2 x daily - 7 x weekly - 1 sets - 3 reps - 30-60 sec hold - Standard Plank  - 2 x daily - 7 x weekly - 1 sets - 5 reps - max hold - Side Plank on Elbow  - 2 x daily - 7 x weekly - 1 sets - 5 reps -  max hold - Child's Pose Stretch  - 1 x daily - 7 x weekly - 1 sets - 3 reps - 20-30 sec hold - Child's Pose with Sidebending  - 2 x daily - 7 x weekly - 1 sets - 2 reps -  60 sec hold - Standing Glute Med Mobilization with Small Ball on Wall  - 1 x daily - 7 x weekly - 3 sets - 10 reps - Thoracic Mobilization on Foam Roll  - 1 x daily - 7 x weekly - 3 sets - 10 reps  ASSESSMENT:  CLINICAL IMPRESSION: Blake Baldwin presents today with no pain, but states he continues to have low back pain shifting from side to side. Lateral shift was not present today. We progressed HEP with core and flexibility. He has good form with planks and side planks but lacks endurance. Good response to MFR to bil lumbar. He is reluctant to try DN but I feel he would benefit from this in both lumbar and right gluteals.  Dutch continues to demonstrate potential for improvement and would benefit from continued skilled therapy to address impairments.       OBJECTIVE IMPAIRMENTS decreased activity tolerance, decreased ROM, decreased strength, hypomobility, increased muscle spasms, impaired flexibility, improper body mechanics, postural dysfunction, and pain.   ACTIVITY LIMITATIONS lifting, sitting, standing, and transfers  PARTICIPATION LIMITATIONS: occupation requires frequent position changes  PERSONAL FACTORS  N/A    REHAB POTENTIAL: Excellent  CLINICAL DECISION MAKING: Stable/uncomplicated  EVALUATION COMPLEXITY: Low   GOALS: Goals reviewed with patient? Yes  SHORT TERM GOALS: Target date: 10/20/2021 (Remove Blue Hyperlink)  Patient will be independent with initial HEP.  Baseline:  Goal status: INITIAL    LONG TERM GOALS: Target date: 11/17/2021  (Remove Blue Hyperlink)  Patient will be independent with advanced/ongoing HEP to improve outcomes and carryover.  Baseline:  Goal status: INITIAL  2.  Patient will report 75% improvement in low back pain to improve QOL.  Baseline:  Goal status: INITIAL  3.  Patient will demonstrate full pain Baldwin lumbar ROM to perform ADLs.   Baseline:  Goal status: INITIAL  4.  Patient will demonstrate improved core strength  as demonstrated by solid trunk stabilization with MMT of hip flexors and ability to perform high level core exercises such as a plank. Baseline:  Goal status: INITIAL  5.  Patient will report <= to 8% disabiity on the modified Oswestry to demonstrate improved functional ability.  Baseline:  Goal status: INITIAL   6.  Patient will tolerate standing for >= 1 hour without increased pain in order to perform ADLS and his job requirements. Baseline:  Goal status: INITIAL  7.  Patient to demonstrate ability to achieve and maintain good spinal alignment/posturing and body mechanics needed for daily activities. Baseline: lateral shift Goal status: INITIAL  8. Patient will demonstrate correct form with dead lifts and squats in order to progress functional strengthening in a gym setting. Baseline:  Goal status: INITIAL    PLAN: PT FREQUENCY: 2x/week  PT DURATION: 2 weeks  PLANNED INTERVENTIONS: Therapeutic exercises, Therapeutic activity, Neuromuscular re-education, Patient/Family education, Self Care, Joint mobilization, Dry Needling, Electrical stimulation, Spinal mobilization, Cryotherapy, Moist heat, Taping, Traction, Ionotophoresis '4mg'$ /ml Dexamethasone, and Manual therapy.  PLAN FOR NEXT SESSION:  assess for lateral shift, manual/DN to bil lumbar/QL and Rt glutes, focus on core strengthening and functional core activities, dead lifts, lunge with rotations, spinal mobs prn, body mechanics and ADL modifications    Bina Veenstra, PT 10/11/2021, 12:03 PM

## 2021-10-11 ENCOUNTER — Encounter: Payer: Self-pay | Admitting: Physical Therapy

## 2021-10-11 ENCOUNTER — Ambulatory Visit: Payer: 59 | Attending: Family | Admitting: Physical Therapy

## 2021-10-11 DIAGNOSIS — R293 Abnormal posture: Secondary | ICD-10-CM | POA: Insufficient documentation

## 2021-10-11 DIAGNOSIS — M6281 Muscle weakness (generalized): Secondary | ICD-10-CM | POA: Insufficient documentation

## 2021-10-11 DIAGNOSIS — R252 Cramp and spasm: Secondary | ICD-10-CM | POA: Insufficient documentation

## 2021-10-11 DIAGNOSIS — M5459 Other low back pain: Secondary | ICD-10-CM | POA: Insufficient documentation

## 2021-10-14 ENCOUNTER — Ambulatory Visit: Payer: 59 | Admitting: Physical Therapy

## 2021-10-14 ENCOUNTER — Encounter: Payer: Self-pay | Admitting: Physical Therapy

## 2021-10-14 DIAGNOSIS — M5459 Other low back pain: Secondary | ICD-10-CM

## 2021-10-14 DIAGNOSIS — R252 Cramp and spasm: Secondary | ICD-10-CM

## 2021-10-14 DIAGNOSIS — M6281 Muscle weakness (generalized): Secondary | ICD-10-CM

## 2021-10-14 DIAGNOSIS — R293 Abnormal posture: Secondary | ICD-10-CM

## 2021-10-14 NOTE — Therapy (Signed)
OUTPATIENT PHYSICAL THERAPY TREATMENT   Patient Name: Blake Baldwin MRN: [id number] DOB:12/12/1985, 36 y.o., male Today's Date: 10/14/2021   PT End of Session - 10/14/21 1102     Visit Number 3    Number of Visits 12    Date for PT Re-Evaluation 11/17/21    Authorization Type U[employer]    PT Start Time 5277    PT Stop Time 8242    PT Time Calculation (min) 52 min    Activity Tolerance Patient tolerated treatment well    Behavior During Therapy [facility] for tasks assessed/performed               Past Medical History:  Diagnosis Date   Bronchitis    Gallstones    Past Surgical History:  Procedure Laterality Date   APPENDECTOMY     CHOLECYSTECTOMY     Patient Active Problem List   Diagnosis Date Noted   Low back pain 09/21/2021   Musculoskeletal neck pain 06/07/2013   Lipoma of right shoulder 05/08/2013   GERD (gastroesophageal reflux disease) 06/22/2012   Routine general medical examination at a health care facility 01/18/2012   Nevus of eye 01/18/2012   Thrombocytopenia ([place]) 01/18/2012   History of tobacco abuse 01/18/2012    PCP: [name], NP  REFERRING PROVIDER: [name], NP  REFERRING DIAG: M54.50 (ICD-10-CM) - Low back pain, unspecified back pain laterality, unspecified chronicity, unspecified whether sciatica present  RATIONALE FOR EVALUATION AND TREATMENT: Rehabilitation  THERAPY DIAG:  Other low back pain  Cramp and spasm  Abnormal posture  Muscle weakness (generalized)  ONSET DATE: March  SUBJECTIVE:                                                                                                                                                                                           SUBJECTIVE STATEMENT: Pt reports pressure build-up with prolonged standing limiting standing tolerance at work.  PAIN:  Are you having pain? Yes: NPRS scale: 0/10 Pain location: left lumbar Pain description: discomfort Aggravating factors:  prolonged standing Relieving factors: sitting, bend over, walking  PERTINENT HISTORY:  Gall bladder and appendix removed  PRECAUTIONS: None  WEIGHT BEARING RESTRICTIONS No  FALLS:  Has patient fallen in last 6 months? Yes. 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   PLOF: Independent  PATIENT GOALS Relieve the pain   OBJECTIVE:  DIAGNOSTIC FINDINGS:  XR negative per pt  PATIENT SURVEYS:  Modified Oswestry 14% disability   SCREENING FOR RED FLAGS: Bowel or bladder incontinence: No Spinal tumors: No Cauda equina syndrome: No Compression fracture: No Abdominal aneurysm: No  COGNITION:  Overall cognitive status: Within functional limits for tasks assessed     SENSATION: WFL  MUSCLE LENGTH: PP:IRJJOA bil Quads:WNL ITB:WNL Piriformis:marked bil Hip Flexors: WNL Heelcords: bil tightness   POSTURE: rounded shoulders, forward head, and left lateral shift of shoulders  10/11/21 No lateral shift presents Tight lats R > L tested in hooklying)  PALPATION: TTP: increased tone and tenderness right glut min/med, bil lumbar/QL  UPA left lumbar: pain at L5/S1 and L2/3, L1/2, R L4/5, L3/4, L2/3 spasm response, CPA mild discomfort throughout   LUMBAR ROM:   Active  A/PROM  eval  Flexion Hands to mid shin*  Extension full  Right lateral flexion Full but tight on left  Left lateral flexion fulll  Right rotation full  Left rotation full   (Blank rows = not tested)  LOWER EXTREMITY ROM:     WNL  LOWER EXTREMITY MMT:    MMT Right eval Left eval  Hip flexion 5 5  Hip extension 5 5  Hip abduction 5 5  Hip adduction 5 5  Knee flexion 5 5  Knee extension 5 5  Ankle dorsiflexion 5 5    (Blank rows = not tested)  LUMBAR SPECIAL TESTS:  Straight leg raise test: Negative, Slump test: Negative, and FABER test: Negative    TODAY'S TREATMENT   10/14/21 THERAPEUTIC EXERCISE: to improve flexibility, strength and mobility.  Verbal and tactile cues throughout for technique. Rec Bike - L3 x 5 min Standing R/L QL stretch at door frame 2 x 30" Quadruped cat-cow 10 x 5" Quadruped alt UE & LE raises 10 x 3" Quad rocking from child's pose to prone press-up 10 x 5" Standing TrA + GTB row 10 x 5" Standing TrA + GTB scap retraction/shoulder extension 10 x 5" Standing GTB B pallof press 10 x 3-5"  MANUAL THERAPY: To promote normalized muscle tension, improved flexibility, improved joint mobility, and reduced pain. Deep MFR and STM including foam roller to bil lumbar/QL UPA mobs bil lumbar K-tape 30-50% stretch to B thoracolumbar paraspinals + perpendicular strip at level of greatest pain/tension   10/11/21 Rec bike L 3 x 5 min Seated fig 4  x 1 min bil Seated HS stretch x 1 min bil Dynamic HS x 10 ea Plank 3 x 10 sec (less than 10 sec on reps 2 and 3) Side planks 2x10 sec ea Prone press ups x 10 Child's pose and then right and left 30 sec  Supine OH flexion to assess lats (bil tightness R>L)  Self Care: MFR using large foam roller in supine for lumbar/thoracic and gluteals. Standing demonstrated as well.  Standing MFR with ball  Manual therapy: Deep MFR and STM to bil lumbar/QL UPA mobs bil lumbar   10/06/21 TPR left QL in SDLY Therex: see HEP   PATIENT EDUCATION:  Education details: HEP update and Ktape wearing and removal instructions Person educated: Patient Education method: Explanation, Demonstration, Verbal cues, and Handouts Education comprehension: verbalized understanding and returned demonstration   HOME EXERCISE PROGRAM: Access Code: [email or web address] URL: [email or web address] Date: 10/14/2021 Prepared by: Annie Paras  Exercises -  Right Standing Lateral Shift Correction at Wall - Repetitions  - 2 x daily - 7 x weekly - 1 sets - 5 reps - 5-10 sec hold -  Seated Hamstring Stretch  - 2 x daily - 7 x weekly - 1 sets - 3 reps - 30-60 sec hold - Seated Piriformis Stretch with Trunk Bend  - 2 x daily - 7 x weekly - 1 sets - 3 reps - 30-60 sec  hold - Gastroc Stretch on Wall  - 2 x daily - 7 x weekly - 1 sets - 3 reps - 30-60 sec hold - Standard Plank  - 2 x daily - 7 x weekly - 1 sets - 5 reps - max hold - Side Plank on Elbow  - 2 x daily - 7 x weekly - 1 sets - 5 reps - max hold - Child's Pose Stretch  - 1 x daily - 7 x weekly - 1 sets - 3 reps - 20-30 sec hold - Child's Pose with Sidebending  - 2 x daily - 7 x weekly - 1 sets - 2 reps - 60 sec hold - Standing Glute Med Mobilization with Small Ball on Wall  - 1 x daily - 7 x weekly - 3 sets - 10 reps - Thoracic Mobilization on Foam Roll  - 1 x daily - 7 x weekly - 3 sets - 10 reps - Standing Quadratus Lumborum Stretch with Doorway  - 2 x daily - 7 x weekly - 3 reps - 30 sec hold - Standing Shoulder Row with Anchored Resistance  - 1 x daily - 7 x weekly - 2 sets - 10 reps - 5 sec hold - Scapular Retraction with Resistance Advanced  - 1 x daily - 7 x weekly - 2 sets - 10 reps - 5 sec hold - Isometric Anti-Rotation Press  - 1 x daily - 3-4 x weekly - 2 sets - 10 reps - 3-5 sec hold  ASSESSMENT:  CLINICAL IMPRESSION: [place] reports only "discomfort" rather than pain today but still having difficulty making it through a full work shift in standing due to low back pain. He notes some continued tightness in upper lumbar paraspinals and QLs, mostly on the L, but wishes to defer DN for now. Continued stretching adding standing QL stretch and lumbopelvic mobilization as well as progression of core/lumbopelvic strengthening. No pain other than mild discomfort with pallof presses but fatigued quickly with most exercises. Session concluded with trial of kinesiotape to B thoracolumbar  paraspinals to help facilitate muscle relaxation and decreased sensation of "pressure" in his back.  OBJECTIVE IMPAIRMENTS decreased activity tolerance, decreased ROM, decreased strength, hypomobility, increased muscle spasms, impaired flexibility, improper body mechanics, postural dysfunction, and pain.   ACTIVITY LIMITATIONS lifting, sitting, standing, and transfers  PARTICIPATION LIMITATIONS: occupation requires frequent position changes  PERSONAL FACTORS  N/A    REHAB POTENTIAL: Excellent  CLINICAL DECISION MAKING: Stable/uncomplicated  EVALUATION COMPLEXITY: Low   GOALS: Goals reviewed with patient? Yes  SHORT TERM GOALS: Target date: 10/20/2021 (Remove Blue Hyperlink)  Patient will be independent with initial HEP.  Baseline:  Goal status: IN PROGRESS    LONG TERM GOALS: Target date: 11/17/2021  (Remove Blue Hyperlink)  Patient will be independent with advanced/ongoing HEP to improve outcomes and carryover.  Baseline:  Goal status: IN PROGRESS  2.  Patient will report 75% improvement in low back pain to improve QOL.  Baseline:  Goal status: IN PROGRESS  3.  Patient will demonstrate full pain free lumbar ROM to perform ADLs.   Baseline:  Goal status: IN PROGRESS  4.  Patient will demonstrate improved core strength as demonstrated by solid trunk stabilization  with MMT of hip flexors and ability to perform high level core exercises such as a plank. Baseline:  Goal status: IN PROGRESS  5.  Patient will report <= to 8% disabiity on the modified Oswestry to demonstrate improved functional ability.  Baseline:  Goal status: IN PROGRESS   6.  Patient will tolerate standing for >= 1 hour without increased pain in order to perform ADLS and his job requirements. Baseline:  Goal status: IN PROGRESS  7.  Patient to demonstrate ability to achieve and maintain good spinal alignment/posturing and body mechanics needed for daily activities. Baseline: lateral shift Goal  status: IN PROGRESS  8. Patient will demonstrate correct form with dead lifts and squats in order to progress functional strengthening in a gym setting. Baseline:  Goal status: IN PROGRESS    PLAN: PT FREQUENCY: 2x/week  PT DURATION: 6 weeks  PLANNED INTERVENTIONS: Therapeutic exercises, Therapeutic activity, Neuromuscular re-education, Patient/Family education, Self Care, Joint mobilization, Dry Needling, Electrical stimulation, Spinal mobilization, Cryotherapy, Moist heat, Taping, Traction, Ionotophoresis '4mg'$ /ml Dexamethasone, and Manual therapy.  PLAN FOR NEXT SESSION:  assess for lateral shift, manual/DN to bil lumbar/QL and Rt glutes, focus on core strengthening and functional core activities, dead lifts, lunge with rotations, spinal mobs prn, body mechanics and ADL modifications    Percival Spanish, PT 10/14/2021, 12:17 PM

## 2021-10-18 ENCOUNTER — Ambulatory Visit: Payer: 59

## 2021-10-18 DIAGNOSIS — M5459 Other low back pain: Secondary | ICD-10-CM | POA: Diagnosis not present

## 2021-10-18 DIAGNOSIS — R252 Cramp and spasm: Secondary | ICD-10-CM

## 2021-10-18 DIAGNOSIS — R293 Abnormal posture: Secondary | ICD-10-CM

## 2021-10-18 DIAGNOSIS — M6281 Muscle weakness (generalized): Secondary | ICD-10-CM

## 2021-10-18 NOTE — Therapy (Signed)
OUTPATIENT PHYSICAL THERAPY TREATMENT   Patient Name: Blake Baldwin MRN: [id number] DOB:1985/12/22, 36 y.o., male Today's Date: 10/18/2021   PT End of Session - 10/18/21 1153     Visit Number 4    Number of Visits 12    Date for PT Re-Evaluation 11/17/21    Authorization Type [employer]    PT Start Time 1106    PT Stop Time 1147    PT Time Calculation (min) 41 min    Activity Tolerance Patient tolerated treatment well    Behavior During Therapy [facility] for tasks assessed/performed                Past Medical History:  Diagnosis Date   Bronchitis    Gallstones    Past Surgical History:  Procedure Laterality Date   APPENDECTOMY     CHOLECYSTECTOMY     Patient Active Problem List   Diagnosis Date Noted   Low back pain 09/21/2021   Musculoskeletal neck pain 06/07/2013   Lipoma of right shoulder 05/08/2013   GERD (gastroesophageal reflux disease) 06/22/2012   Routine general medical examination at a health care facility 01/18/2012   Nevus of eye 01/18/2012   Thrombocytopenia ([place]) 01/18/2012   History of tobacco abuse 01/18/2012    PCP: [name], NP  REFERRING PROVIDER: [name], NP  REFERRING DIAG: M54.50 (ICD-10-CM) - Low back pain, unspecified back pain laterality, unspecified chronicity, unspecified whether sciatica present  RATIONALE FOR EVALUATION AND TREATMENT: Rehabilitation  THERAPY DIAG:  Other low back pain  Cramp and spasm  Abnormal posture  Muscle weakness (generalized)  ONSET DATE: March  SUBJECTIVE:                                                                                                                                                                                           SUBJECTIVE STATEMENT: Doing good now but later in the day it will start to hurt.  PAIN:  Are you having pain? Yes: NPRS scale: 0/10 Pain location: left lumbar Pain description: discomfort Aggravating factors: prolonged  standing Relieving factors: sitting, bend over, walking  PERTINENT HISTORY:  Gall bladder and appendix removed  PRECAUTIONS: None  WEIGHT BEARING RESTRICTIONS No  FALLS:  Has patient fallen in last 6 months? Yes. 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   PLOF: Independent  PATIENT GOALS Relieve the pain  OBJECTIVE:   DIAGNOSTIC FINDINGS:  XR negative per pt  PATIENT SURVEYS:  Modified Oswestry 14% disability   SCREENING FOR RED FLAGS: Bowel or bladder incontinence: No Spinal tumors: No Cauda equina syndrome: No Compression fracture: No Abdominal aneurysm: No  COGNITION:  Overall cognitive status: Within functional limits for tasks assessed     SENSATION: WFL  MUSCLE LENGTH: HD:QQIWLN bil Quads:WNL ITB:WNL Piriformis:marked bil Hip Flexors: WNL Heelcords: bil tightness   POSTURE: rounded shoulders, forward head, and left lateral shift of shoulders  10/11/21 No lateral shift presents Tight lats R > L tested in hooklying)  PALPATION: TTP: increased tone and tenderness right glut min/med, bil lumbar/QL  UPA left lumbar: pain at L5/S1 and L2/3, L1/2, R L4/5, L3/4, L2/3 spasm response, CPA mild discomfort throughout   LUMBAR ROM:   Active  A/PROM  eval  Flexion Hands to mid shin*  Extension full  Right lateral flexion Full but tight on left  Left lateral flexion fulll  Right rotation full  Left rotation full   (Blank rows = not tested)  LOWER EXTREMITY ROM:     WNL  LOWER EXTREMITY MMT:    MMT Right eval Left eval  Hip flexion 5 5  Hip extension 5 5  Hip abduction 5 5  Hip adduction 5 5  Knee flexion 5 5  Knee extension 5 5  Ankle dorsiflexion 5 5   (Blank  rows = not tested)  LUMBAR SPECIAL TESTS:  Straight leg raise test: Negative, Slump test: Negative, and FABER test: Negative    TODAY'S TREATMENT  10/18/21 Therapeutic Exercise: Bike L3x64mn QL stretch in doorway x 30 sec L lateral bend at wall 3x15 sec Quadruped bird dog x 10 bil Quadruped fire hydrant x 10 bil Quadruped cat/cow x 10 both directions Standing row GTB x 10 Standing pallof press GTB 2x15 bil  Manual Therapy: STM to L QL and lumbar paraspinals  10/14/21 THERAPEUTIC EXERCISE: to improve flexibility, strength and mobility.  Verbal and tactile cues throughout for technique. Rec Bike - L3 x 5 min Standing R/L QL stretch at door frame 2 x 30" Quadruped cat-cow 10 x 5" Quadruped alt UE & LE raises 10 x 3" Quad rocking from child's pose to prone press-up 10 x 5" Standing TrA + GTB row 10 x 5" Standing TrA + GTB scap retraction/shoulder extension 10 x 5" Standing GTB B pallof press 10 x 3-5"  MANUAL THERAPY: To promote normalized muscle tension, improved flexibility, improved joint mobility, and reduced pain. Deep MFR and STM including foam roller to bil lumbar/QL UPA mobs bil lumbar K-tape 30-50% stretch to B thoracolumbar paraspinals + perpendicular strip at level of greatest pain/tension   10/11/21 Rec bike L 3 x 5 min Seated fig 4  x 1 min bil Seated HS stretch x 1 min bil Dynamic HS x 10 ea Plank 3 x 10 sec (less than 10 sec on reps 2 and 3) Side planks 2x10 sec ea Prone press ups x 10 Child's pose and then right and left 30 sec  Supine OH flexion to assess lats (bil tightness R>L)  Self Care: MFR using large foam roller in supine for lumbar/thoracic and gluteals. Standing demonstrated as well.  Standing MFR with ball  Manual therapy: Deep MFR and STM to bil lumbar/QL UPA mobs bil lumbar      PATIENT EDUCATION:  Education details: HEP update and Ktape wearing and removal instructions Person educated: Patient Education method: Explanation,  Demonstration, Verbal cues, and Handouts Education  comprehension: verbalized understanding and returned demonstration   HOME EXERCISE PROGRAM: Access Code: [email or web address] URL: [email or web address] Date: 10/14/2021 Prepared by: Annie Paras  Exercises - Right Standing Lateral Shift Correction at [place]  - 2 x daily - 7 x weekly - 1 sets - 5 reps - 5-10 sec hold - Seated Hamstring Stretch  - 2 x daily - 7 x weekly - 1 sets - 3 reps - 30-60 sec hold - Seated Piriformis Stretch with Trunk Bend  - 2 x daily - 7 x weekly - 1 sets - 3 reps - 30-60 sec  hold - Gastroc Stretch on Wall  - 2 x daily - 7 x weekly - 1 sets - 3 reps - 30-60 sec hold - Standard Plank  - 2 x daily - 7 x weekly - 1 sets - 5 reps - max hold - Side Plank on Elbow  - 2 x daily - 7 x weekly - 1 sets - 5 reps - max hold - Child's Pose Stretch  - 1 x daily - 7 x weekly - 1 sets - 3 reps - 20-30 sec hold - Child's Pose with Sidebending  - 2 x daily - 7 x weekly - 1 sets - 2 reps - 60 sec hold - Standing Glute Med Mobilization with Small Ball on Wall  - 1 x daily - 7 x weekly - 3 sets - 10 reps - Thoracic Mobilization on Foam Roll  - 1 x daily - 7 x weekly - 3 sets - 10 reps - Standing Quadratus Lumborum Stretch with Doorway  - 2 x daily - 7 x weekly - 3 reps - 30 sec hold - Standing Shoulder Row with Anchored Resistance  - 1 x daily - 7 x weekly - 2 sets - 10 reps - 5 sec hold - Scapular Retraction with Resistance Advanced  - 1 x daily - 7 x weekly - 2 sets - 10 reps - 5 sec hold - Isometric Anti-Rotation Press  - 1 x daily - 3-4 x weekly - 2 sets - 10 reps - 3-5 sec hold  ASSESSMENT:  CLINICAL IMPRESSION: Pt responded well to treatment. Continued to progress core stabilization exercises and required cues to keep neutral spine. He was unsteady with the bird dogs and noted shoulder fatigue with quadruped exercises. He showed a good understanding of his HEP. He mostly c/o tightness along L QL and lumbar  paraspinal area with prolonged standing, he was consulted about DN and stated that he may be willing to try it for Wednesday appointment.  OBJECTIVE IMPAIRMENTS decreased activity tolerance, decreased ROM, decreased strength, hypomobility, increased muscle spasms, impaired flexibility, improper body mechanics, postural dysfunction, and pain.   ACTIVITY LIMITATIONS lifting, sitting, standing, and transfers  PARTICIPATION LIMITATIONS: occupation requires frequent position changes  PERSONAL FACTORS  N/A    REHAB POTENTIAL: Excellent  CLINICAL DECISION MAKING: Stable/uncomplicated  EVALUATION COMPLEXITY: Low   GOALS: Goals reviewed with patient? Yes  SHORT TERM GOALS: Target date: 10/20/2021 (Remove Blue Hyperlink)  Patient will be independent with initial HEP.  Baseline:  Goal status: IN PROGRESS    LONG TERM GOALS: Target date: 11/17/2021  (Remove Blue Hyperlink)  Patient will be independent with advanced/ongoing HEP to improve outcomes and carryover.  Baseline:  Goal status: IN PROGRESS  2.  Patient will report 75% improvement in low back pain to improve QOL.  Baseline:  Goal status: IN PROGRESS  3.  Patient will demonstrate full pain free lumbar ROM to perform  ADLs.   Baseline:  Goal status: IN PROGRESS  4.  Patient will demonstrate improved core strength as demonstrated by solid trunk stabilization with MMT of hip flexors and ability to perform high level core exercises such as a plank. Baseline:  Goal status: IN PROGRESS  5.  Patient will report <= to 8% disabiity on the modified Oswestry to demonstrate improved functional ability.  Baseline:  Goal status: IN PROGRESS   6.  Patient will tolerate standing for >= 1 hour without increased pain in order to perform ADLS and his job requirements. Baseline:  Goal status: IN PROGRESS  7.  Patient to demonstrate ability to achieve and maintain good spinal alignment/posturing and body mechanics needed for daily  activities. Baseline: lateral shift Goal status: IN PROGRESS  8. Patient will demonstrate correct form with dead lifts and squats in order to progress functional strengthening in a gym setting. Baseline:  Goal status: IN PROGRESS    PLAN: PT FREQUENCY: 2x/week  PT DURATION: 6 weeks  PLANNED INTERVENTIONS: Therapeutic exercises, Therapeutic activity, Neuromuscular re-education, Patient/Family education, Self Care, Joint mobilization, Dry Needling, Electrical stimulation, Spinal mobilization, Cryotherapy, Moist heat, Taping, Traction, Ionotophoresis '4mg'$ /ml Dexamethasone, and Manual therapy.  PLAN FOR NEXT SESSION:  assess for lateral shift, manual/DN to bil lumbar/QL and Rt glutes, focus on core strengthening and functional core activities, dead lifts, lunge with rotations, spinal mobs prn, body mechanics and ADL modifications    Emmary Culbreath L Lindsi Bayliss, PTA 10/18/2021, 11:53 AM

## 2021-10-20 ENCOUNTER — Ambulatory Visit: Payer: 59 | Admitting: Physical Therapy

## 2021-10-20 ENCOUNTER — Encounter: Payer: Self-pay | Admitting: Physical Therapy

## 2021-10-20 DIAGNOSIS — M6281 Muscle weakness (generalized): Secondary | ICD-10-CM

## 2021-10-20 DIAGNOSIS — R293 Abnormal posture: Secondary | ICD-10-CM

## 2021-10-20 DIAGNOSIS — R252 Cramp and spasm: Secondary | ICD-10-CM

## 2021-10-20 DIAGNOSIS — M5459 Other low back pain: Secondary | ICD-10-CM | POA: Diagnosis not present

## 2021-10-20 NOTE — Therapy (Signed)
OUTPATIENT PHYSICAL THERAPY TREATMENT   Patient Name: RANJIT ASHURST MRN: [id number] DOB:10-Dec-1985, 36 y.o., male Today's Date: 10/20/2021   PT End of Session - 10/20/21 1100     Visit Number 5    Number of Visits 12    Date for PT Re-Evaluation 11/17/21    Authorization Type U[employer]    PT Start Time 1100    PT Stop Time 4259    PT Time Calculation (min) 59 min    Activity Tolerance Patient tolerated treatment well;Patient limited by pain    Behavior During Therapy [facility] for tasks assessed/performed                 Past Medical History:  Diagnosis Date   Bronchitis    Gallstones    Past Surgical History:  Procedure Laterality Date   APPENDECTOMY     CHOLECYSTECTOMY     Patient Active Problem List   Diagnosis Date Noted   Low back pain 09/21/2021   Musculoskeletal neck pain 06/07/2013   Lipoma of right shoulder 05/08/2013   GERD (gastroesophageal reflux disease) 06/22/2012   Routine general medical examination at a health care facility 01/18/2012   Nevus of eye 01/18/2012   Thrombocytopenia ([place]) 01/18/2012   History of tobacco abuse 01/18/2012    PCP: [name], NP  REFERRING PROVIDER: [name], NP  REFERRING DIAG: M54.50 (ICD-10-CM) - Low back pain, unspecified back pain laterality, unspecified chronicity, unspecified whether sciatica present  RATIONALE FOR EVALUATION AND TREATMENT: Rehabilitation  THERAPY DIAG:  Other low back pain  Cramp and spasm  Abnormal posture  Muscle weakness (generalized)  ONSET DATE: March  SUBJECTIVE:                                                                                                                                                                                           SUBJECTIVE STATEMENT: Pt reports he can somewhat manage the pain if he changes position from sitting to standing every ~10 minutes. He is considering DN.  PAIN:  Are you having pain? Yes: NPRS scale: 0/10 Pain  location: left lumbar Pain description: discomfort Aggravating factors: prolonged standing Relieving factors: sitting, bend over, walking  PERTINENT HISTORY:  Gall bladder and appendix removed  PRECAUTIONS: None  WEIGHT BEARING RESTRICTIONS No  FALLS:  Has patient fallen in last 6 months? Yes. 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   PLOF: Independent  PATIENT GOALS Relieve the pain   OBJECTIVE:   DIAGNOSTIC FINDINGS:  XR negative per pt  PATIENT SURVEYS:  Modified Oswestry 14% disability   SCREENING FOR RED FLAGS: Bowel or bladder incontinence: No Spinal tumors: No Cauda equina syndrome: No Compression fracture: No Abdominal aneurysm: No  COGNITION:  Overall cognitive status: Within functional limits for tasks assessed     SENSATION: WFL  MUSCLE LENGTH: OZ:HYQMVH bil Quads:WNL ITB:WNL Piriformis:marked bil Hip Flexors: WNL Heelcords: bil tightness   POSTURE: rounded shoulders, forward head, and left lateral shift of shoulders  10/11/21 No lateral shift presents Tight lats R > L tested in hooklying)  PALPATION: TTP: increased tone and tenderness right glut min/med, bil lumbar/QL  UPA left lumbar: pain at L5/S1 and L2/3, L1/2, R L4/5, L3/4, L2/3 spasm response, CPA mild discomfort throughout   LUMBAR ROM:   Active  A/PROM  eval  Flexion Hands to mid shin*  Extension full  Right lateral flexion Full but tight on left  Left lateral flexion fulll  Right rotation full  Left rotation full   (Blank rows = not tested)  LOWER EXTREMITY ROM:     WNL  LOWER EXTREMITY MMT:    MMT Right eval Left eval  Hip flexion 5 5  Hip extension 5 5  Hip abduction 5 5  Hip  adduction 5 5  Knee flexion 5 5  Knee extension 5 5  Ankle dorsiflexion 5 5   (Blank rows = not tested)  LUMBAR SPECIAL TESTS:  Straight leg raise test: Negative, Slump test: Negative, and FABER test: Negative    TODAY'S TREATMENT   10/20/21 THERAPEUTIC EXERCISE: to improve flexibility, strength and mobility.  Verbal and tactile cues throughout for technique. Rec Bike - L3 x 6 min Quad rocking from child's pose to prone press-up 10 x 5" Quadruped bird dog x 5 - difficulty maintaining neutral spine Quadruped alt UE & LE raises 10 x 3" TRX squat 10 x 3" TRX low row 10 x 3", 2 sets - cues for neutral spine TrA + hip flexion isometric with distal LE supported on orange Pball 10 x 5" TrA + alt LE extension from hooklying 10 x 3"  MANUAL THERAPY: To promote normalized muscle tension, improved flexibility, improved joint mobility, and reduced pain. Skilled palpation and monitoring of soft tissue during DN Trigger Point Dry-Needling  Treatment instructions: Expect mild to moderate muscle soreness. S/S of pneumothorax if dry needled over a lung field, and to seek immediate medical attention should they occur. Patient verbalized understanding of these instructions and education. Patient Consent Given: Yes Education handout provided: Previously provided Muscles treated: B lumbar erector spinae & L lumbar multifidi  Electrical stimulation performed: No Parameters: N/A Treatment response/outcome: Twitch Response Elicited and Palpable Increase in Muscle Length STM/DTM, manual TPR and pin & stretch to muscles addressed with DN  MODALITIES: IFC to B lumbar paraspinals/QL, 80-150 Hz, intensity to patient tolerance x 15 min Moist heat to B lumbar paraspinals/QL x 15 min   10/18/21 Therapeutic Exercise: Bike L3x62mn QL stretch in doorway x 30 sec L lateral bend at wall 3x15 sec Quadruped bird dog x 10 bil Quadruped fire hydrant x 10 bil Quadruped cat/cow x 10 both directions Standing row  GTB x 10 Standing pallof press GTB 2x15 bil  Manual Therapy: STM to L QL and lumbar paraspinals   10/14/21 THERAPEUTIC EXERCISE: to improve flexibility, strength and mobility.  Verbal and tactile cues throughout for  technique. Rec Bike - L3 x 5 min Standing R/L QL stretch at door frame 2 x 30" Quadruped cat-cow 10 x 5" Quadruped alt UE & LE raises 10 x 3" Quad rocking from child's pose to prone press-up 10 x 5" Standing TrA + GTB row 10 x 5" Standing TrA + GTB scap retraction/shoulder extension 10 x 5" Standing GTB B pallof press 10 x 3-5"  MANUAL THERAPY: To promote normalized muscle tension, improved flexibility, improved joint mobility, and reduced pain. Deep MFR and STM including foam roller to bil lumbar/QL UPA mobs bil lumbar K-tape 30-50% stretch to B thoracolumbar paraspinals + perpendicular strip at level of greatest pain/tension   PATIENT EDUCATION:  Education details: HEP update and Ktape wearing and removal instructions Person educated: Patient Education method: Explanation, Demonstration, Verbal cues, and Handouts Education comprehension: verbalized understanding and returned demonstration   HOME EXERCISE PROGRAM: Access Code: [email or web address] URL: [email or web address] Date: 10/14/2021 Prepared by: Annie Paras  Exercises - Right Standing Lateral Shift Correction at [place]  - 2 x daily - 7 x weekly - 1 sets - 5 reps - 5-10 sec hold - Seated Hamstring Stretch  - 2 x daily - 7 x weekly - 1 sets - 3 reps - 30-60 sec hold - Seated Piriformis Stretch with Trunk Bend  - 2 x daily - 7 x weekly - 1 sets - 3 reps - 30-60 sec  hold - Gastroc Stretch on Wall  - 2 x daily - 7 x weekly - 1 sets - 3 reps - 30-60 sec hold - Standard Plank  - 2 x daily - 7 x weekly - 1 sets - 5 reps - max hold - Side Plank on Elbow  - 2 x daily - 7 x weekly - 1 sets - 5 reps - max hold - Child's Pose Stretch  - 1 x daily - 7 x weekly - 1 sets - 3 reps - 20-30 sec hold -  Child's Pose with Sidebending  - 2 x daily - 7 x weekly - 1 sets - 2 reps - 60 sec hold - Standing Glute Med Mobilization with Small Ball on Wall  - 1 x daily - 7 x weekly - 3 sets - 10 reps - Thoracic Mobilization on Foam Roll  - 1 x daily - 7 x weekly - 3 sets - 10 reps - Standing Quadratus Lumborum Stretch with Doorway  - 2 x daily - 7 x weekly - 3 reps - 30 sec hold - Standing Shoulder Row with Anchored Resistance  - 1 x daily - 7 x weekly - 2 sets - 10 reps - 5 sec hold - Scapular Retraction with Resistance Advanced  - 1 x daily - 7 x weekly - 2 sets - 10 reps - 5 sec hold - Isometric Anti-Rotation Press  - 1 x daily - 3-4 x weekly - 2 sets - 10 reps - 3-5 sec hold  ASSESSMENT:  CLINICAL IMPRESSION: Bereket reports continues to struggle with managing his pain but is able to get some relief with regular positional changes from sitting to standing. He expressed desire to proceed with trial of DN and requested that his wife be present for emotional support as her remains anxious about the idea of DN. Reinforced to pt that DN is completely optional at his discretion but he expressed desire to proceed. After explanation of DN rational, procedures, outcomes and potential side effects, patient verbalized consent to DN treatment in conjunction with manual STM/DTM and  TPR to reduce ttp/muscle tension. Muscles treated as indicated above. DN produced normal response with good twitches elicited resulting in palpable reduction in pain/ttp and muscle tension but pt having significant difficulty relaxing during treatment. Pt educated to expect mild to moderate muscle soreness for up to 24-48 hrs and instructed to continue prescribed home exercise program and current activity level with pt verbalizing understanding of theses instructions. DN followed by stretching and strengthening to further promote normalization of muscle tension. Session concluded with estim and moist heat to B lumbar paraspinals/QL to promote  further muscle relaxation/relief from muscle spasm.  OBJECTIVE IMPAIRMENTS decreased activity tolerance, decreased ROM, decreased strength, hypomobility, increased muscle spasms, impaired flexibility, improper body mechanics, postural dysfunction, and pain.   ACTIVITY LIMITATIONS lifting, sitting, standing, and transfers  PARTICIPATION LIMITATIONS: occupation requires frequent position changes  PERSONAL FACTORS  N/A    REHAB POTENTIAL: Excellent  CLINICAL DECISION MAKING: Stable/uncomplicated  EVALUATION COMPLEXITY: Low   GOALS: Goals reviewed with patient? Yes  SHORT TERM GOALS: Target date: 10/20/2021 (Remove Blue Hyperlink)  Patient will be independent with initial HEP.  Baseline:  Goal status: IN PROGRESS    LONG TERM GOALS: Target date: 11/17/2021  (Remove Blue Hyperlink)  Patient will be independent with advanced/ongoing HEP to improve outcomes and carryover.  Baseline:  Goal status: IN PROGRESS  2.  Patient will report 75% improvement in low back pain to improve QOL.  Baseline:  Goal status: IN PROGRESS  3.  Patient will demonstrate full pain free lumbar ROM to perform ADLs.   Baseline:  Goal status: IN PROGRESS  4.  Patient will demonstrate improved core strength as demonstrated by solid trunk stabilization with MMT of hip flexors and ability to perform high level core exercises such as a plank. Baseline:  Goal status: IN PROGRESS  5.  Patient will report <= to 8% disabiity on the modified Oswestry to demonstrate improved functional ability.  Baseline:  Goal status: IN PROGRESS   6.  Patient will tolerate standing for >= 1 hour without increased pain in order to perform ADLS and his job requirements. Baseline:  Goal status: IN PROGRESS  7.  Patient to demonstrate ability to achieve and maintain good spinal alignment/posturing and body mechanics needed for daily activities. Baseline: lateral shift Goal status: IN PROGRESS  8. Patient will demonstrate  correct form with dead lifts and squats in order to progress functional strengthening in a gym setting. Baseline:  Goal status: IN PROGRESS    PLAN: PT FREQUENCY: 2x/week  PT DURATION: 6 weeks  PLANNED INTERVENTIONS: Therapeutic exercises, Therapeutic activity, Neuromuscular re-education, Patient/Family education, Self Care, Joint mobilization, Dry Needling, Electrical stimulation, Spinal mobilization, Cryotherapy, Moist heat, Taping, Traction, Ionotophoresis '4mg'$ /ml Dexamethasone, and Manual therapy.  PLAN FOR NEXT SESSION:  assess response to DN & estim - provide info on home TENS unit if benefit noted from estim; assess for lateral shift, manual/DN to bil lumbar/QL and Rt glutes, focus on core strengthening and functional core activities, dead lifts, lunge with rotations, spinal mobs prn, body mechanics and ADL modifications    Percival Spanish, PT 10/20/2021, 1:55 PM

## 2021-10-26 ENCOUNTER — Other Ambulatory Visit (HOSPITAL_BASED_OUTPATIENT_CLINIC_OR_DEPARTMENT_OTHER): Payer: Self-pay

## 2021-10-26 ENCOUNTER — Ambulatory Visit: Payer: 59

## 2021-10-26 DIAGNOSIS — M6281 Muscle weakness (generalized): Secondary | ICD-10-CM

## 2021-10-26 DIAGNOSIS — M5459 Other low back pain: Secondary | ICD-10-CM

## 2021-10-26 DIAGNOSIS — R293 Abnormal posture: Secondary | ICD-10-CM

## 2021-10-26 DIAGNOSIS — R252 Cramp and spasm: Secondary | ICD-10-CM

## 2021-10-26 NOTE — Therapy (Signed)
OUTPATIENT PHYSICAL THERAPY TREATMENT   Patient Name: Blake Baldwin MRN: [id number] DOB:1985-05-08, 36 y.o., male Today's Date: 10/26/2021   PT End of Session - 10/26/21 1153     Visit Number 6    Number of Visits 12    Date for PT Re-Evaluation 11/17/21    Authorization Type UHC    PT Start Time 1103    PT Stop Time 1200    PT Time Calculation (min) 57 min    Activity Tolerance Patient tolerated treatment well    Behavior During Therapy [facility] for tasks assessed/performed                  Past Medical History:  Diagnosis Date   Bronchitis    Gallstones    Past Surgical History:  Procedure Laterality Date   APPENDECTOMY     CHOLECYSTECTOMY     Patient Active Problem List   Diagnosis Date Noted   Low back pain 09/21/2021   Musculoskeletal neck pain 06/07/2013   Lipoma of right shoulder 05/08/2013   GERD (gastroesophageal reflux disease) 06/22/2012   Routine general medical examination at a health care facility 01/18/2012   Nevus of eye 01/18/2012   Thrombocytopenia ([place]) 01/18/2012   History of tobacco abuse 01/18/2012    PCP: [name], NP  REFERRING PROVIDER: [name], NP  REFERRING DIAG: M54.50 (ICD-10-CM) - Low back pain, unspecified back pain laterality, unspecified chronicity, unspecified whether sciatica present  RATIONALE FOR EVALUATION AND TREATMENT: Rehabilitation  THERAPY DIAG:  Other low back pain  Cramp and spasm  Abnormal posture  Muscle weakness (generalized)  ONSET DATE: March  SUBJECTIVE:                                                                                                                                                                                           SUBJECTIVE STATEMENT: Pt reports the DN helped even though it wasn't pleasant.   PAIN:  Are you having pain? Yes: NPRS scale: 0/10 Pain location: left lumbar Pain description: discomfort Aggravating factors: prolonged  standing Relieving factors: sitting, bend over, walking  PERTINENT HISTORY:  Gall bladder and appendix removed  PRECAUTIONS: None  WEIGHT BEARING RESTRICTIONS No  FALLS:  Has patient fallen in last 6 months? Yes. 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   PLOF: Independent  PATIENT GOALS Relieve the pain  OBJECTIVE:   DIAGNOSTIC FINDINGS:  XR negative per pt  PATIENT SURVEYS:  Modified Oswestry 14% disability   SCREENING FOR RED FLAGS: Bowel or bladder incontinence: No Spinal tumors: No Cauda equina syndrome: No Compression fracture: No Abdominal aneurysm: No  COGNITION:  Overall cognitive status: Within functional limits for tasks assessed     SENSATION: WFL  MUSCLE LENGTH: SP:QZRAQT bil Quads:WNL ITB:WNL Piriformis:marked bil Hip Flexors: WNL Heelcords: bil tightness   POSTURE: rounded shoulders, forward head, and left lateral shift of shoulders  10/11/21 No lateral shift presents Tight lats R > L tested in hooklying)  PALPATION: TTP: increased tone and tenderness right glut min/med, bil lumbar/QL  UPA left lumbar: pain at L5/S1 and L2/3, L1/2, R L4/5, L3/4, L2/3 spasm response, CPA mild discomfort throughout   LUMBAR ROM:   Active  A/PROM  eval  Flexion Hands to mid shin*  Extension full  Right lateral flexion Full but tight on left  Left lateral flexion fulll  Right rotation full  Left rotation full   (Blank rows = not tested)  LOWER EXTREMITY ROM:     WNL  LOWER EXTREMITY MMT:    MMT Right eval Left eval  Hip flexion 5 5  Hip extension 5 5  Hip abduction 5 5  Hip adduction 5 5  Knee flexion 5 5  Knee extension 5 5  Ankle dorsiflexion 5 5   (Blank  rows = not tested)  LUMBAR SPECIAL TESTS:  Straight leg raise test: Negative, Slump test: Negative, and FABER test: Negative    TODAY'S TREATMENT  10/26/21 TherEx: Rec Bike L4x4mn LTR 3x10" each way DKTC stretch 2 x 30 sec bil TrA + alt LE extension from hooklying 10 x 3"  [employer] Therapy: STM to L thoracolumbar paraspinals Tried long leg distraction but no relief reported  Estim + moist heat 12 min IFC to bil lumbar paraspinals  10/20/21 THERAPEUTIC EXERCISE: to improve flexibility, strength and mobility.  Verbal and tactile cues throughout for technique. Rec Bike - L3 x 6 min Quad rocking from child's pose to prone press-up 10 x 5" Quadruped bird dog x 5 - difficulty maintaining neutral spine Quadruped alt UE & LE raises 10 x 3" TRX squat 10 x 3" TRX low row 10 x 3", 2 sets - cues for neutral spine TrA + hip flexion isometric with distal LE supported on orange Pball 10 x 5" TrA + alt LE extension from hooklying 10 x 3"  MANUAL THERAPY: To promote normalized muscle tension, improved flexibility, improved joint mobility, and reduced pain. Skilled palpation and monitoring of soft tissue during DN Trigger Point Dry-Needling  Treatment instructions: Expect mild to moderate muscle soreness. S/S of pneumothorax if dry needled over a lung field, and to seek immediate medical attention should they occur. Patient verbalized understanding of these instructions and education. Patient Consent Given: Yes Education handout provided: Previously provided Muscles treated: B lumbar erector spinae & L lumbar multifidi  Electrical stimulation performed: No Parameters: N/A Treatment response/outcome: Twitch Response Elicited and Palpable Increase in Muscle Length STM/DTM, manual TPR and pin & stretch to muscles addressed with DN  MODALITIES: IFC to B lumbar paraspinals/QL, 80-150 Hz, intensity to patient tolerance x 15 min Moist heat to B lumbar paraspinals/QL x 15  min   10/18/21 Therapeutic Exercise: Bike L3x624m QL stretch in doorway x 30 sec L lateral bend at wall 3x15 sec Quadruped bird dog x 10 bil Quadruped fire hydrant x 10 bil Quadruped cat/cow x 10 both directions  Standing row GTB x 10 Standing pallof press GTB 2x15 bil  Manual Therapy: STM to L QL and lumbar paraspinals   10/14/21 THERAPEUTIC EXERCISE: to improve flexibility, strength and mobility.  Verbal and tactile cues throughout for technique. Rec Bike - L3 x 5 min Standing R/L QL stretch at door frame 2 x 30" Quadruped cat-cow 10 x 5" Quadruped alt UE & LE raises 10 x 3" Quad rocking from child's pose to prone press-up 10 x 5" Standing TrA + GTB row 10 x 5" Standing TrA + GTB scap retraction/shoulder extension 10 x 5" Standing GTB B pallof press 10 x 3-5"  MANUAL THERAPY: To promote normalized muscle tension, improved flexibility, improved joint mobility, and reduced pain. Deep MFR and STM including foam roller to bil lumbar/QL UPA mobs bil lumbar K-tape 30-50% stretch to B thoracolumbar paraspinals + perpendicular strip at level of greatest pain/tension   PATIENT EDUCATION:  Education details: HEP update and Ktape wearing and removal instructions Person educated: Patient Education method: Explanation, Demonstration, Verbal cues, and Handouts Education comprehension: verbalized understanding and returned demonstration   HOME EXERCISE PROGRAM: Access Code: [email or web address] URL: [email or web address] Date: 10/14/2021 Prepared by: Annie Paras  Exercises - Right Standing Lateral Shift Correction at [place]  - 2 x daily - 7 x weekly - 1 sets - 5 reps - 5-10 sec hold - Seated Hamstring Stretch  - 2 x daily - 7 x weekly - 1 sets - 3 reps - 30-60 sec hold - Seated Piriformis Stretch with Trunk Bend  - 2 x daily - 7 x weekly - 1 sets - 3 reps - 30-60 sec  hold - Gastroc Stretch on Wall  - 2 x daily - 7 x weekly - 1 sets - 3 reps - 30-60 sec hold -  Standard Plank  - 2 x daily - 7 x weekly - 1 sets - 5 reps - max hold - Side Plank on Elbow  - 2 x daily - 7 x weekly - 1 sets - 5 reps - max hold - Child's Pose Stretch  - 1 x daily - 7 x weekly - 1 sets - 3 reps - 20-30 sec hold - Child's Pose with Sidebending  - 2 x daily - 7 x weekly - 1 sets - 2 reps - 60 sec hold - Standing Glute Med Mobilization with Small Ball on Wall  - 1 x daily - 7 x weekly - 3 sets - 10 reps - Thoracic Mobilization on Foam Roll  - 1 x daily - 7 x weekly - 3 sets - 10 reps - Standing Quadratus Lumborum Stretch with Doorway  - 2 x daily - 7 x weekly - 3 reps - 30 sec hold - Standing Shoulder Row with Anchored Resistance  - 1 x daily - 7 x weekly - 2 sets - 10 reps - 5 sec hold - Scapular Retraction with Resistance Advanced  - 1 x daily - 7 x weekly - 2 sets - 10 reps - 5 sec hold - Isometric Anti-Rotation Press  - 1 x daily - 3-4 x weekly - 2 sets - 10 reps - 3-5 sec hold  ASSESSMENT:  CLINICAL IMPRESSION: Pt arrived noting benefit from DN last visit. Continued to work on lumbopelvic mobility to reduce tightness. Afterward did STM to further reduce tightness and improve pain. He did not have any improvement with the long leg distraction. HE does wish to try DN next visit again and will plan to educate on  TENs device if more benefit noted from [place] decreased activity tolerance, decreased ROM, decreased strength, hypomobility, increased muscle spasms, impaired flexibility, improper body mechanics, postural dysfunction, and pain.   ACTIVITY LIMITATIONS lifting, sitting, standing, and transfers  PARTICIPATION LIMITATIONS: occupation requires frequent position changes  PERSONAL FACTORS  N/A    REHAB POTENTIAL: Excellent  CLINICAL DECISION MAKING: Stable/uncomplicated  EVALUATION COMPLEXITY: Low   GOALS: Goals reviewed with patient? Yes  SHORT TERM GOALS: Target date: 10/20/2021 (Remove Blue Hyperlink)  Patient will be independent with  initial HEP.  Baseline:  Goal status: MET    LONG TERM GOALS: Target date: 11/17/2021  (Remove Blue Hyperlink)  Patient will be independent with advanced/ongoing HEP to improve outcomes and carryover.  Baseline:  Goal status: IN PROGRESS  2.  Patient will report 75% improvement in low back pain to improve QOL.  Baseline:  Goal status: IN PROGRESS  3.  Patient will demonstrate full pain free lumbar ROM to perform ADLs.   Baseline:  Goal status: IN PROGRESS  4.  Patient will demonstrate improved core strength as demonstrated by solid trunk stabilization with MMT of hip flexors and ability to perform high level core exercises such as a plank. Baseline:  Goal status: IN PROGRESS  5.  Patient will report <= to 8% disabiity on the modified Oswestry to demonstrate improved functional ability.  Baseline:  Goal status: IN PROGRESS   6.  Patient will tolerate standing for >= 1 hour without increased pain in order to perform ADLS and his job requirements. Baseline:  Goal status: IN PROGRESS  7.  Patient to demonstrate ability to achieve and maintain good spinal alignment/posturing and body mechanics needed for daily activities. Baseline: lateral shift Goal status: IN PROGRESS  8. Patient will demonstrate correct form with dead lifts and squats in order to progress functional strengthening in a gym setting. Baseline:  Goal status: IN PROGRESS    PLAN: PT FREQUENCY: 2x/week  PT DURATION: 6 weeks  PLANNED INTERVENTIONS: Therapeutic exercises, Therapeutic activity, Neuromuscular re-education, Patient/Family education, Self Care, Joint mobilization, Dry Needling, Electrical stimulation, Spinal mobilization, Cryotherapy, Moist heat, Taping, Traction, Ionotophoresis 65m/ml Dexamethasone, and Manual therapy.  PLAN FOR NEXT SESSION:  assess response to DN & estim - provide info on home TENS unit if benefit noted from estim; assess for lateral shift, manual/DN to bil lumbar/QL and Rt  glutes, focus on core strengthening and functional core activities, dead lifts, lunge with rotations, spinal mobs prn, body mechanics and ADL modifications    BArtist Pais PTA 10/26/2021, 12:07 PM

## 2021-10-28 ENCOUNTER — Ambulatory Visit: Payer: 59 | Admitting: Physical Therapy

## 2021-10-28 ENCOUNTER — Encounter: Payer: Self-pay | Admitting: Physical Therapy

## 2021-10-28 DIAGNOSIS — R252 Cramp and spasm: Secondary | ICD-10-CM

## 2021-10-28 DIAGNOSIS — R293 Abnormal posture: Secondary | ICD-10-CM

## 2021-10-28 DIAGNOSIS — M6281 Muscle weakness (generalized): Secondary | ICD-10-CM

## 2021-10-28 DIAGNOSIS — M5459 Other low back pain: Secondary | ICD-10-CM | POA: Diagnosis not present

## 2021-10-28 NOTE — Therapy (Signed)
OUTPATIENT PHYSICAL THERAPY TREATMENT   Patient Name: Blake Baldwin MRN: [id number] DOB:July 11, 1985, 36 y.o., male Today's Date: 10/28/2021   PT End of Session - 10/28/21 1101     Visit Number 7    Number of Visits 12    Date for PT Re-Evaluation 11/17/21    Authorization Type [employer]    PT Start Time 1101    PT Stop Time 0160    PT Time Calculation (min) 51 min    Activity Tolerance Patient tolerated treatment well    Behavior During Therapy [facility] for tasks assessed/performed                   Past Medical History:  Diagnosis Date   Bronchitis    Gallstones    Past Surgical History:  Procedure Laterality Date   APPENDECTOMY     CHOLECYSTECTOMY     Patient Active Problem List   Diagnosis Date Noted   Low back pain 09/21/2021   Musculoskeletal neck pain 06/07/2013   Lipoma of right shoulder 05/08/2013   GERD (gastroesophageal reflux disease) 06/22/2012   Routine general medical examination at a health care facility 01/18/2012   Nevus of eye 01/18/2012   Thrombocytopenia ([place]) 01/18/2012   History of tobacco abuse 01/18/2012    PCP: [name], NP  REFERRING PROVIDER: [name], NP  REFERRING DIAG: M54.50 (ICD-10-CM) - Low back pain, unspecified back pain laterality, unspecified chronicity, unspecified whether sciatica present  RATIONALE FOR EVALUATION AND TREATMENT: Rehabilitation  THERAPY DIAG:  Other low back pain  Cramp and spasm  Abnormal posture  Muscle weakness (generalized)  ONSET DATE: March  SUBJECTIVE:                                                                                                                                                                                           SUBJECTIVE STATEMENT: Pt reports the DN "took the edge off" and made work more bearable.  PAIN:  Are you having pain? Yes: NPRS scale: 1-2/10 Pain location: mid/left lumbar Pain description: discomfort Aggravating factors:  prolonged standing Relieving factors: sitting, bend over, walking  PERTINENT HISTORY:  Gall bladder and appendix removed  PRECAUTIONS: None  WEIGHT BEARING RESTRICTIONS No  FALLS:  Has patient fallen in last 6 months? Yes. 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   PLOF: Independent  PATIENT GOALS Relieve  the pain   OBJECTIVE:   DIAGNOSTIC FINDINGS:  XR negative per pt  PATIENT SURVEYS:  Modified Oswestry 14% disability   SCREENING FOR RED FLAGS: Bowel or bladder incontinence: No Spinal tumors: No Cauda equina syndrome: No Compression fracture: No Abdominal aneurysm: No  COGNITION:  Overall cognitive status: Within functional limits for tasks assessed     SENSATION: WFL  MUSCLE LENGTH: NT:ZGYFVC bil Quads:WNL ITB:WNL Piriformis:marked bil Hip Flexors: WNL Heelcords: bil tightness   POSTURE: rounded shoulders, forward head, and left lateral shift of shoulders  10/11/21 No lateral shift presents Tight lats R > L tested in hooklying)  PALPATION: TTP: increased tone and tenderness right glut min/med, bil lumbar/QL  UPA left lumbar: pain at L5/S1 and L2/3, L1/2, R L4/5, L3/4, L2/3 spasm response, CPA mild discomfort throughout   LUMBAR ROM:   Active  A/PROM  eval  Flexion Hands to mid shin*  Extension full  Right lateral flexion Full but tight on left  Left lateral flexion fulll  Right rotation full  Left rotation full   (Blank rows = not tested)  LOWER EXTREMITY ROM:     WNL  LOWER EXTREMITY MMT:    MMT Right eval Left eval  Hip flexion 5 5  Hip extension 5 5  Hip abduction 5 5  Hip adduction 5 5  Knee flexion 5 5  Knee extension 5 5  Ankle dorsiflexion 5 5    (Blank rows = not tested)  LUMBAR SPECIAL TESTS:  Straight leg raise test: Negative, Slump test: Negative, and FABER test: Negative   TODAY'S TREATMENT   10/28/21 THERAPEUTIC EXERCISE: to improve flexibility, strength and mobility.  Verbal and tactile cues throughout for technique. Rec Bike - L3 x 6 min Lumbar extension with elbows against wall 2 x 30" Lumbar extension in standing 5 x 5"  MANUAL THERAPY: To promote normalized muscle tension, improved flexibility, improved joint mobility, and reduced pain. Skilled palpation and monitoring of soft tissue during DN Trigger Point Dry-Needling  Treatment instructions: Expect mild to moderate muscle soreness. S/S of pneumothorax if dry needled over a lung field, and to seek immediate medical attention should they occur. Patient verbalized understanding of these instructions and education. Patient Consent Given: Yes Education handout provided: Previously provided Muscles treated: B lumbar multifidi Electrical stimulation performed: No Parameters: N/A Treatment response/outcome: Twitch Response Elicited and Palpable Increase in Muscle Length STM/DTM, manual TPR and pin & stretch to muscles addressed with DN  MODALITIES: Mechanical lumbar traction - neutral pull in hooklying - 100#/75# - 60 sec on/20 sec rest x 10 min (pt weight ~210#)  SELF CARE: Provided info on home TENS unit options   10/26/21 TherEx: Rec Bike L4x26mn LTR 3x10" each way DKTC stretch 2 x 30 sec bil TrA + alt LE extension from hooklying 10 x 3"  [employer] Therapy: STM to L thoracolumbar paraspinals Tried long leg distraction but no relief reported  Estim + moist heat 12 min IFC to bil lumbar paraspinals   10/20/21 THERAPEUTIC EXERCISE: to improve flexibility, strength and mobility.  Verbal and tactile cues throughout for technique. Rec Bike - L3 x 6 min Quad rocking from child's pose to prone press-up 10 x 5" Quadruped bird dog x 5 - difficulty maintaining  neutral spine Quadruped alt UE & LE raises 10 x 3" TRX squat 10 x 3" TRX low row 10 x 3", 2 sets - cues for neutral spine TrA + hip flexion isometric with distal LE supported on orange Pball 10 x  5" TrA + alt LE extension from hooklying 10 x 3"  MANUAL THERAPY: To promote normalized muscle tension, improved flexibility, improved joint mobility, and reduced pain. Skilled palpation and monitoring of soft tissue during DN Trigger Point Dry-Needling  Treatment instructions: Expect mild to moderate muscle soreness. S/S of pneumothorax if dry needled over a lung field, and to seek immediate medical attention should they occur. Patient verbalized understanding of these instructions and education. Patient Consent Given: Yes Education handout provided: Previously provided Muscles treated: B lumbar erector spinae & L lumbar multifidi  Electrical stimulation performed: No Parameters: N/A Treatment response/outcome: Twitch Response Elicited and Palpable Increase in Muscle Length STM/DTM, manual TPR and pin & stretch to muscles addressed with DN  MODALITIES: IFC to B lumbar paraspinals/QL, 80-150 Hz, intensity to patient tolerance x 15 min Moist heat to B lumbar paraspinals/QL x 15 min   PATIENT EDUCATION:  Education details: role of DN, DN rational, procedure, outcomes, potential side effects, and recommended post-treatment exercises/activity, and home TENS unit options Person educated: Patient Education method: Explanation, Demonstration, and Handouts Education comprehension: verbalized understanding   HOME EXERCISE PROGRAM: Access Code: [email or web address] URL: [email or web address] Date: 10/28/2021 Prepared by: Annie Paras  Exercises - Right Standing Lateral Shift Correction at [place]  - 2 x daily - 7 x weekly - 1 sets - 5 reps - 5-10 sec hold - Seated Hamstring Stretch  - 2 x daily - 7 x weekly - 1 sets - 3 reps - 30-60 sec hold - Seated Piriformis Stretch with Trunk  Bend  - 2 x daily - 7 x weekly - 1 sets - 3 reps - 30-60 sec  hold - Gastroc Stretch on Wall  - 2 x daily - 7 x weekly - 1 sets - 3 reps - 30-60 sec hold - Standard Plank  - 2 x daily - 7 x weekly - 1 sets - 5 reps - max hold - Side Plank on Elbow  - 2 x daily - 7 x weekly - 1 sets - 5 reps - max hold - Child's Pose Stretch  - 1 x daily - 7 x weekly - 1 sets - 3 reps - 20-30 sec hold - Child's Pose with Sidebending  - 2 x daily - 7 x weekly - 1 sets - 2 reps - 60 sec hold - Standing Glute Med Mobilization with Small Ball on Wall  - 1 x daily - 7 x weekly - 3 sets - 10 reps - Thoracic Mobilization on Foam Roll  - 1 x daily - 7 x weekly - 3 sets - 10 reps - Standing Quadratus Lumborum Stretch with Doorway  - 2 x daily - 7 x weekly - 3 reps - 30 sec hold - Standing Shoulder Row with Anchored Resistance  - 1 x daily - 7 x weekly - 2 sets - 10 reps - 5 sec hold - Scapular Retraction with Resistance Advanced  - 1 x daily - 7 x weekly - 2 sets - 10 reps - 5 sec hold - Isometric Anti-Rotation Press  - 1 x daily - 3-4 x weekly - 2 sets - 10 reps - 3-5 sec hold  Patient Education - TENS Therapy - TENS Unit - Trigger Point Dry Needling  ASSESSMENT:  CLINICAL IMPRESSION: Sylvestre reports benefit from initial session of DN and wanted to repeat this today but remains very apprehensive about the needling. MT performed incorporating DN to B lumbar multifidi today. Pt continues to require cues to relax  and breathe during DN. DN causing increased diaphoresis and slight nausea today but overall slightly better tolerated than on last attempt. Reinforced extension based exercises to help reduce c/o radicular LE symptoms, reviewing prone exercises as well as standing options. Attempted manual traction in hooklying today with pt still not noting any difference but may be due to limited ability to create enough distraction, therefore session concluded with trial of mechanical traction. Info provided on home TENS unit  options to promote further pain relief and muscle relaxation at home.  OBJECTIVE IMPAIRMENTS decreased activity tolerance, decreased ROM, decreased strength, hypomobility, increased muscle spasms, impaired flexibility, improper body mechanics, postural dysfunction, and pain.   ACTIVITY LIMITATIONS lifting, sitting, standing, and transfers  PARTICIPATION LIMITATIONS: occupation requires frequent position changes  PERSONAL FACTORS  N/A    REHAB POTENTIAL: Excellent  CLINICAL DECISION MAKING: Stable/uncomplicated  EVALUATION COMPLEXITY: Low   GOALS: Goals reviewed with patient? Yes  SHORT TERM GOALS: Target date: 10/20/2021 (Remove Blue Hyperlink)  Patient will be independent with initial HEP.  Baseline:  Goal status: MET    LONG TERM GOALS: Target date: 11/17/2021  (Remove Blue Hyperlink)  Patient will be independent with advanced/ongoing HEP to improve outcomes and carryover.  Baseline:  Goal status: IN PROGRESS  2.  Patient will report 75% improvement in low back pain to improve QOL.  Baseline:  Goal status: IN PROGRESS  3.  Patient will demonstrate full pain free lumbar ROM to perform ADLs.   Baseline:  Goal status: IN PROGRESS  4.  Patient will demonstrate improved core strength as demonstrated by solid trunk stabilization with MMT of hip flexors and ability to perform high level core exercises such as a plank. Baseline:  Goal status: IN PROGRESS  5.  Patient will report <= to 8% disabiity on the modified Oswestry to demonstrate improved functional ability.  Baseline:  Goal status: IN PROGRESS   6.  Patient will tolerate standing for >= 1 hour without increased pain in order to perform ADLS and his job requirements. Baseline:  Goal status: IN PROGRESS  7.  Patient to demonstrate ability to achieve and maintain good spinal alignment/posturing and body mechanics needed for daily activities. Baseline: lateral shift Goal status: IN PROGRESS  8. Patient will  demonstrate correct form with dead lifts and squats in order to progress functional strengthening in a gym setting. Baseline:  Goal status: IN PROGRESS    PLAN: PT FREQUENCY: 2x/week  PT DURATION: 6 weeks  PLANNED INTERVENTIONS: Therapeutic exercises, Therapeutic activity, Neuromuscular re-education, Patient/Family education, Self Care, Joint mobilization, Dry Needling, Electrical stimulation, Spinal mobilization, Cryotherapy, Moist heat, Taping, Traction, Ionotophoresis 104m/ml Dexamethasone, and Manual therapy.  PLAN FOR NEXT SESSION:  assess response to DN and traction; assess for lateral shift, manual/DN to bil lumbar/QL and Rt glutes, focus on core strengthening and functional core activities, dead lifts, lunge with rotations, spinal mobs prn, body mechanics and ADL modifications    JPercival Spanish PT 10/28/2021, 11:57 AM

## 2021-11-01 ENCOUNTER — Ambulatory Visit: Payer: 59

## 2021-11-04 ENCOUNTER — Ambulatory Visit: Payer: 59

## 2021-11-07 NOTE — Therapy (Signed)
OUTPATIENT PHYSICAL THERAPY TREATMENT   Patient Name: Blake Baldwin MRN: [id number] DOB:Mar 10, 1985, 36 y.o., male Today's Date: 11/08/2021   PT End of Session - 11/08/21 1016     Visit Number 8    Number of Visits 12    Date for PT Re-Evaluation 11/17/21    Authorization Type [employer]    PT Start Time 1017    PT Stop Time 1100    PT Time Calculation (min) 43 min    Activity Tolerance Patient tolerated treatment well    Behavior During Therapy [facility] for tasks assessed/performed                    Past Medical History:  Diagnosis Date   Bronchitis    Gallstones    Past Surgical History:  Procedure Laterality Date   APPENDECTOMY     CHOLECYSTECTOMY     Patient Active Problem List   Diagnosis Date Noted   Low back pain 09/21/2021   Musculoskeletal neck pain 06/07/2013   Lipoma of right shoulder 05/08/2013   GERD (gastroesophageal reflux disease) 06/22/2012   Routine general medical examination at a health care facility 01/18/2012   Nevus of eye 01/18/2012   Thrombocytopenia ([place]) 01/18/2012   History of tobacco abuse 01/18/2012    PCP: [name], NP  REFERRING PROVIDER: [name], NP  REFERRING DIAG: M54.50 (ICD-10-CM) - Low back pain, unspecified back pain laterality, unspecified chronicity, unspecified whether sciatica present  RATIONALE FOR EVALUATION AND TREATMENT: Rehabilitation  THERAPY DIAG:  Other low back pain  Cramp and spasm  Abnormal posture  Muscle weakness (generalized)  ONSET DATE: March  SUBJECTIVE:                                                                                                                                                                                           SUBJECTIVE STATEMENT: Back is a little sore because I walked 8 hours at the [facility]. I think the traction helped. Pain seems to be shifting lower.   PAIN:  Are you having pain? Yes: NPRS scale: 3/10 Pain location:  mid/left lumbar Pain description: discomfort Aggravating factors: prolonged standing Relieving factors: sitting, bend over, walking  PERTINENT HISTORY:  Gall bladder and appendix removed  PRECAUTIONS: None  WEIGHT BEARING RESTRICTIONS No  FALLS:  Has patient fallen in last 6 months? Yes. 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   PLOF: Independent  PATIENT GOALS Relieve the pain   OBJECTIVE:   DIAGNOSTIC FINDINGS:  XR negative per pt  PATIENT SURVEYS:  Modified Oswestry 14% disability   SCREENING FOR RED FLAGS: Bowel or bladder incontinence: No Spinal tumors: No Cauda equina syndrome: No Compression fracture: No Abdominal aneurysm: No  COGNITION:  Overall cognitive status: Within functional limits for tasks assessed     SENSATION: WFL  MUSCLE LENGTH: PI:RJJOAC bil Quads:WNL ITB:WNL Piriformis:marked bil Hip Flexors: WNL Heelcords: bil tightness   POSTURE: rounded shoulders, forward head, and left lateral shift of shoulders  10/11/21 No lateral shift presents Tight lats R > L tested in hooklying)  PALPATION: TTP: increased tone and tenderness right glut min/med, bil lumbar/QL  UPA left lumbar: pain at L5/S1 and L2/3, L1/2, R L4/5, L3/4, L2/3 spasm response, CPA mild discomfort throughout   LUMBAR ROM:   Active  A/PROM  eval  Flexion Hands to mid shin*  Extension full  Right lateral flexion Full but tight on left  Left lateral flexion fulll  Right rotation full  Left rotation full   (Blank rows = not tested)  LOWER EXTREMITY ROM:     WNL  LOWER EXTREMITY MMT:    MMT Right eval Left eval  Hip flexion 5 5  Hip extension 5 5  Hip abduction 5 5  Hip adduction 5  5  Knee flexion 5 5  Knee extension 5 5  Ankle dorsiflexion 5 5   (Blank rows = not tested)  LUMBAR SPECIAL TESTS:  Straight leg raise test: Negative, Slump test: Negative, and FABER test: Negative   TODAY'S TREATMENT  11/08/21 THERAPEUTIC EXERCISE: to improve flexibility, strength and mobility.  Verbal and tactile cues throughout for technique. Rec Bike - L3 x 6 min Lumbar ext 10 sec hold x 5 cues to breathe Doorway QL stretch L 2x30 sec   Quadruped bird dog x 5    Plank x 30 sec   Side Plank bil 30 sec R, 12 sec L    MODALITIES: Mechanical lumbar traction - neutral pull in hooklying - 110#/80# - 60 sec on/20 sec rest x 10 min (pt weight  (weighed himself this weekend~215#)   10/28/21 THERAPEUTIC EXERCISE: to improve flexibility, strength and mobility.  Verbal and tactile cues throughout for technique. Rec Bike - L3 x 6 min Lumbar extension with elbows against wall 2 x 30" Lumbar extension in standing 5 x 5"  MANUAL THERAPY: To promote normalized muscle tension, improved flexibility, improved joint mobility, and reduced pain. Skilled palpation and monitoring of soft tissue during DN Trigger Point Dry-Needling  Treatment instructions: Expect mild to moderate muscle soreness. S/S of pneumothorax if dry needled over a lung field, and to seek immediate medical attention should they occur. Patient verbalized understanding of these instructions and education. Patient Consent Given: Yes Education handout provided: Previously provided Muscles treated: B lumbar multifidi Electrical stimulation performed: No Parameters: N/A Treatment response/outcome: Twitch Response Elicited and Palpable Increase in Muscle Length STM/DTM, manual TPR and pin & stretch to muscles addressed with DN  MODALITIES: Mechanical lumbar traction - neutral pull in hooklying - 100#/75# - 60 sec on/20 sec rest x 10 min (pt weight ~210#)  SELF CARE: Provided info on home TENS unit  options   10/26/21 TherEx: Rec Bike L4x67mn LTR 3x10" each way DKTC stretch 2 x 30 sec bil TrA + alt LE extension from hooklying 10 x 3"  [employer] Therapy: STM to L  thoracolumbar paraspinals Tried long leg distraction but no relief reported  Estim + moist heat 12 min IFC to bil lumbar paraspinals   10/20/21 THERAPEUTIC EXERCISE: to improve flexibility, strength and mobility.  Verbal and tactile cues throughout for technique. Rec Bike - L3 x 6 min Quad rocking from child's pose to prone press-up 10 x 5" Quadruped bird dog x 5 - difficulty maintaining neutral spine Quadruped alt UE & LE raises 10 x 3" TRX squat 10 x 3" TRX low row 10 x 3", 2 sets - cues for neutral spine TrA + hip flexion isometric with distal LE supported on orange Pball 10 x 5" TrA + alt LE extension from hooklying 10 x 3"  MANUAL THERAPY: To promote normalized muscle tension, improved flexibility, improved joint mobility, and reduced pain. Skilled palpation and monitoring of soft tissue during DN Trigger Point Dry-Needling  Treatment instructions: Expect mild to moderate muscle soreness. S/S of pneumothorax if dry needled over a lung field, and to seek immediate medical attention should they occur. Patient verbalized understanding of these instructions and education. Patient Consent Given: Yes Education handout provided: Previously provided Muscles treated: B lumbar erector spinae & L lumbar multifidi  Electrical stimulation performed: No Parameters: N/A Treatment response/outcome: Twitch Response Elicited and Palpable Increase in Muscle Length STM/DTM, manual TPR and pin & stretch to muscles addressed with DN  MODALITIES: IFC to B lumbar paraspinals/QL, 80-150 Hz, intensity to patient tolerance x 15 min Moist heat to B lumbar paraspinals/QL x 15 min   PATIENT EDUCATION:  Education details: role of DN, DN rational, procedure, outcomes, potential side effects, and recommended post-treatment  exercises/activity, and home TENS unit options Person educated: Patient Education method: Explanation, Demonstration, and Handouts Education comprehension: verbalized understanding   HOME EXERCISE PROGRAM: Access Code: [email or web address] URL: [email or web address] Date: 10/28/2021 Prepared by: Annie Paras  Exercises - Right Standing Lateral Shift Correction at [place]  - 2 x daily - 7 x weekly - 1 sets - 5 reps - 5-10 sec hold - Seated Hamstring Stretch  - 2 x daily - 7 x weekly - 1 sets - 3 reps - 30-60 sec hold - Seated Piriformis Stretch with Trunk Bend  - 2 x daily - 7 x weekly - 1 sets - 3 reps - 30-60 sec  hold - Gastroc Stretch on Wall  - 2 x daily - 7 x weekly - 1 sets - 3 reps - 30-60 sec hold - Standard Plank  - 2 x daily - 7 x weekly - 1 sets - 5 reps - max hold - Side Plank on Elbow  - 2 x daily - 7 x weekly - 1 sets - 5 reps - max hold - Child's Pose Stretch  - 1 x daily - 7 x weekly - 1 sets - 3 reps - 20-30 sec hold - Child's Pose with Sidebending  - 2 x daily - 7 x weekly - 1 sets - 2 reps - 60 sec hold - Standing Glute Med Mobilization with Small Ball on Wall  - 1 x daily - 7 x weekly - 3 sets - 10 reps - Thoracic Mobilization on Foam Roll  - 1 x daily - 7 x weekly - 3 sets - 10 reps - Standing Quadratus Lumborum Stretch with Doorway  - 2 x daily - 7 x weekly - 3 reps - 30 sec hold - Standing Shoulder Row with Anchored Resistance  - 1 x daily - 7 x weekly - 2 sets -  10 reps - 5 sec hold - Scapular Retraction with Resistance Advanced  - 1 x daily - 7 x weekly - 2 sets - 10 reps - 5 sec hold - Isometric Anti-Rotation Press  - 1 x daily - 3-4 x weekly - 2 sets - 10 reps - 3-5 sec hold  Patient Education - TENS Therapy - TENS Unit - Trigger Point Dry Needling  ASSESSMENT:  CLINICAL IMPRESSION: Darrius reports relief from traction. He still reports tightness/pain in the left lumbar area but feels it is lower than previously. He was able to tolerate  walking for eight hours this weekend with increased soreness. He demonstrated improvement with lumbar stabilization today with bird dog and core strength improving as well. Dewight continues to demonstrate potential for improvement and would benefit from continued skilled therapy to address impairments.    OBJECTIVE IMPAIRMENTS decreased activity tolerance, decreased ROM, decreased strength, hypomobility, increased muscle spasms, impaired flexibility, improper body mechanics, postural dysfunction, and pain.   ACTIVITY LIMITATIONS lifting, sitting, standing, and transfers  PARTICIPATION LIMITATIONS: occupation requires frequent position changes  PERSONAL FACTORS  N/A    REHAB POTENTIAL: Excellent  CLINICAL DECISION MAKING: Stable/uncomplicated  EVALUATION COMPLEXITY: Low   GOALS: Goals reviewed with patient? Yes  SHORT TERM GOALS: Target date: 10/20/2021 (Remove Blue Hyperlink)  Patient will be independent with initial HEP.  Baseline:  Goal status: MET    LONG TERM GOALS: Target date: 11/17/2021  (Remove Blue Hyperlink)  Patient will be independent with advanced/ongoing HEP to improve outcomes and carryover.  Baseline:  Goal status: IN PROGRESS  2.  Patient will report 75% improvement in low back pain to improve QOL.  Baseline:  Goal status: IN PROGRESS  3.  Patient will demonstrate full pain free lumbar ROM to perform ADLs.   Baseline:  Goal status: IN PROGRESS  4.  Patient will demonstrate improved core strength as demonstrated by solid trunk stabilization with MMT of hip flexors and ability to perform high level core exercises such as a plank. Baseline:  Goal status: IN PROGRESS  5.  Patient will report <= to 8% disabiity on the modified Oswestry to demonstrate improved functional ability.  Baseline:  Goal status: IN PROGRESS   6.  Patient will tolerate standing for >= 1 hour without increased pain in order to perform ADLS and his job requirements. Baseline:   Goal status: IN PROGRESS  7.  Patient to demonstrate ability to achieve and maintain good spinal alignment/posturing and body mechanics needed for daily activities. Baseline: lateral shift Goal status: IN PROGRESS  8. Patient will demonstrate correct form with dead lifts and squats in order to progress functional strengthening in a gym setting. Baseline:  Goal status: IN PROGRESS    PLAN: PT FREQUENCY: 2x/week  PT DURATION: 6 weeks  PLANNED INTERVENTIONS: Therapeutic exercises, Therapeutic activity, Neuromuscular re-education, Patient/Family education, Self Care, Joint mobilization, Dry Needling, Electrical stimulation, Spinal mobilization, Cryotherapy, Moist heat, Taping, Traction, Ionotophoresis 44m/ml Dexamethasone, and Manual therapy.  PLAN FOR NEXT SESSION:   assess response traction; assess for lateral shift, manual/DN to bil lumbar/QL and Rt glutes, focus on core strengthening and functional core activities, dead lifts, lunge with rotations, spinal mobs prn, body mechanics and ADL modifications    Kirsten Mckone, PT 11/08/2021, 12:08 PM

## 2021-11-08 ENCOUNTER — Ambulatory Visit: Payer: 59 | Admitting: Physical Therapy

## 2021-11-08 ENCOUNTER — Encounter: Payer: Self-pay | Admitting: Physical Therapy

## 2021-11-08 DIAGNOSIS — M5459 Other low back pain: Secondary | ICD-10-CM | POA: Diagnosis not present

## 2021-11-08 DIAGNOSIS — R293 Abnormal posture: Secondary | ICD-10-CM

## 2021-11-08 DIAGNOSIS — R252 Cramp and spasm: Secondary | ICD-10-CM

## 2021-11-08 DIAGNOSIS — M6281 Muscle weakness (generalized): Secondary | ICD-10-CM

## 2021-11-11 ENCOUNTER — Ambulatory Visit: Payer: 59 | Attending: Family

## 2021-11-11 DIAGNOSIS — R252 Cramp and spasm: Secondary | ICD-10-CM | POA: Diagnosis present

## 2021-11-11 DIAGNOSIS — M6281 Muscle weakness (generalized): Secondary | ICD-10-CM | POA: Insufficient documentation

## 2021-11-11 DIAGNOSIS — M5459 Other low back pain: Secondary | ICD-10-CM | POA: Insufficient documentation

## 2021-11-11 DIAGNOSIS — R293 Abnormal posture: Secondary | ICD-10-CM | POA: Diagnosis present

## 2021-11-11 NOTE — Therapy (Addendum)
OUTPATIENT PHYSICAL THERAPY TREATMENT / DISCHARGE SUMMARY   Patient Name: Blake Baldwin MRN: [id number] DOB:09-30-1985, 36 y.o., male Today's Date: 11/11/2021   PT End of Session - 11/11/21 1155     Visit Number 9    Number of Visits 12    Date for PT Re-Evaluation 11/17/21    Authorization Type [employer]    PT Start Time 13    PT Stop Time 5027    PT Time Calculation (min) 43 min    Activity Tolerance Patient tolerated treatment well    Behavior During Therapy [facility] for tasks assessed/performed                     Past Medical History:  Diagnosis Date   Bronchitis    Gallstones    Past Surgical History:  Procedure Laterality Date   APPENDECTOMY     CHOLECYSTECTOMY     Patient Active Problem List   Diagnosis Date Noted   Low back pain 09/21/2021   Musculoskeletal neck pain 06/07/2013   Lipoma of right shoulder 05/08/2013   GERD (gastroesophageal reflux disease) 06/22/2012   Routine general medical examination at a health care facility 01/18/2012   Nevus of eye 01/18/2012   Thrombocytopenia ([place]) 01/18/2012   History of tobacco abuse 01/18/2012    PCP: [name], NP  REFERRING PROVIDER: [name], NP  REFERRING DIAG: M54.50 (ICD-10-CM) - Low back pain, unspecified back pain laterality, unspecified chronicity, unspecified whether sciatica present  RATIONALE FOR EVALUATION AND TREATMENT: Rehabilitation  THERAPY DIAG:  Other low back pain  Cramp and spasm  Abnormal posture  Muscle weakness (generalized)  ONSET DATE: March  SUBJECTIVE:                                                                                                                                                                                           SUBJECTIVE STATEMENT: The tightness and pain has now shifted to the lower back.  PAIN:  Are you having pain? Yes: NPRS scale: 3/10 Pain location: mid/left lumbar Pain description: pressure point  pain Aggravating factors: prolonged standing Relieving factors: sitting, bend over, walking  PERTINENT HISTORY:  Gall bladder and appendix removed  PRECAUTIONS: None  WEIGHT BEARING RESTRICTIONS No  FALLS:  Has patient fallen in last 6 months? Yes. 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   PLOF:  Independent  PATIENT GOALS Relieve the pain   OBJECTIVE:   DIAGNOSTIC FINDINGS:  XR negative per pt  PATIENT SURVEYS:  Modified Oswestry 14% disability   SCREENING FOR RED FLAGS: Bowel or bladder incontinence: No Spinal tumors: No Cauda equina syndrome: No Compression fracture: No Abdominal aneurysm: No  COGNITION:  Overall cognitive status: Within functional limits for tasks assessed     SENSATION: WFL  MUSCLE LENGTH: WC:HENIDP bil Quads:WNL ITB:WNL Piriformis:marked bil Hip Flexors: WNL Heelcords: bil tightness   POSTURE: rounded shoulders, forward head, and left lateral shift of shoulders  10/11/21 No lateral shift presents Tight lats R > L tested in hooklying)  PALPATION: TTP: increased tone and tenderness right glut min/med, bil lumbar/QL  UPA left lumbar: pain at L5/S1 and L2/3, L1/2, R L4/5, L3/4, L2/3 spasm response, CPA mild discomfort throughout   LUMBAR ROM:   Active  A/PROM  eval  Flexion Hands to mid shin*  Extension full  Right lateral flexion Full but tight on left  Left lateral flexion fulll  Right rotation full  Left rotation full   (Blank rows = not tested)  LOWER EXTREMITY ROM:     WNL  LOWER EXTREMITY MMT:    MMT Right eval Left eval  Hip flexion 5 5  Hip extension 5 5  Hip abduction 5 5  Hip adduction 5 5  Knee flexion 5 5  Knee extension 5  5  Ankle dorsiflexion 5 5   (Blank rows = not tested)  LUMBAR SPECIAL TESTS:  Straight leg raise test: Negative, Slump test: Negative, and FABER test: Negative   TODAY'S TREATMENT  11/11/21 THERAPEUTIC EXERCISE: to improve flexibility, strength and mobility.  Verbal and tactile cues throughout for technique. Rec Bike - L3 x 6 min LTR 5x10" bil Standing lats/QL stretch in doorway 2x30 sec each  Manual Therapy: STM to L thoracolumbar paraspinals   11/08/21 THERAPEUTIC EXERCISE: to improve flexibility, strength and mobility.  Verbal and tactile cues throughout for technique. Rec Bike - L3 x 6 min Lumbar ext 10 sec hold x 5 cues to breathe Doorway QL stretch L 2x30 sec   Quadruped bird dog x 5    Plank x 30 sec   Side Plank bil 30 sec R, 12 sec L    MODALITIES: Mechanical lumbar traction - neutral pull in hooklying - 110#/80# - 60 sec on/20 sec rest x 10 min (pt weight  (weighed himself this weekend~215#)   10/28/21 THERAPEUTIC EXERCISE: to improve flexibility, strength and mobility.  Verbal and tactile cues throughout for technique. Rec Bike - L3 x 6 min Lumbar extension with elbows against wall 2 x 30" Lumbar extension in standing 5 x 5"  MANUAL THERAPY: To promote normalized muscle tension, improved flexibility, improved joint mobility, and reduced pain. Skilled palpation and monitoring of soft tissue during DN Trigger Point Dry-Needling  Treatment instructions: Expect mild to moderate muscle soreness. S/S of pneumothorax if dry needled over a lung field, and to seek immediate medical attention should they occur. Patient verbalized understanding of these instructions and education. Patient Consent Given: Yes Education handout provided: Previously provided Muscles treated: B lumbar multifidi Electrical stimulation performed: No Parameters: N/A Treatment response/outcome: Twitch Response Elicited and Palpable Increase in Muscle Length STM/DTM, manual TPR and pin & stretch  to muscles addressed with DN  MODALITIES: Mechanical lumbar traction - neutral pull in hooklying - 100#/75# - 60 sec on/20 sec rest x 10 min (pt weight ~210#)  SELF CARE: Provided info on home TENS  unit options   10/26/21 TherEx: Rec Bike L4x60mn LTR 3x10" each way DKTC stretch 2 x 30 sec bil TrA + alt LE extension from hooklying 10 x 3"  [employer] Therapy: STM to L thoracolumbar paraspinals Tried long leg distraction but no relief reported  Estim + moist heat 12 min IFC to bil lumbar paraspinals   10/20/21 THERAPEUTIC EXERCISE: to improve flexibility, strength and mobility.  Verbal and tactile cues throughout for technique. Rec Bike - L3 x 6 min Quad rocking from child's pose to prone press-up 10 x 5" Quadruped bird dog x 5 - difficulty maintaining neutral spine Quadruped alt UE & LE raises 10 x 3" TRX squat 10 x 3" TRX low row 10 x 3", 2 sets - cues for neutral spine TrA + hip flexion isometric with distal LE supported on orange Pball 10 x 5" TrA + alt LE extension from hooklying 10 x 3"  MANUAL THERAPY: To promote normalized muscle tension, improved flexibility, improved joint mobility, and reduced pain. Skilled palpation and monitoring of soft tissue during DN Trigger Point Dry-Needling  Treatment instructions: Expect mild to moderate muscle soreness. S/S of pneumothorax if dry needled over a lung field, and to seek immediate medical attention should they occur. Patient verbalized understanding of these instructions and education. Patient Consent Given: Yes Education handout provided: Previously provided Muscles treated: B lumbar erector spinae & L lumbar multifidi  Electrical stimulation performed: No Parameters: N/A Treatment response/outcome: Twitch Response Elicited and Palpable Increase in Muscle Length STM/DTM, manual TPR and pin & stretch to muscles addressed with DN  MODALITIES: IFC to B lumbar paraspinals/QL, 80-150 Hz, intensity to patient tolerance x 15  min Moist heat to B lumbar paraspinals/QL x 15 min   PATIENT EDUCATION:  Education details: role of DN, DN rational, procedure, outcomes, potential side effects, and recommended post-treatment exercises/activity, and home TENS unit options Person educated: Patient Education method: Explanation, Demonstration, and Handouts Education comprehension: verbalized understanding   HOME EXERCISE PROGRAM: Access Code: [email or web address]: [email or web address] Date: 10/28/2021 Prepared by: JAnnie Paras Exercises - Right Standing Lateral Shift Correction at [place] - 2 x daily - 7 x weekly - 1 sets - 5 reps - 5-10 sec hold - Seated Hamstring Stretch  - 2 x daily - 7 x weekly - 1 sets - 3 reps - 30-60 sec hold - Seated Piriformis Stretch with Trunk Bend  - 2 x daily - 7 x weekly - 1 sets - 3 reps - 30-60 sec  hold - Gastroc Stretch on Wall  - 2 x daily - 7 x weekly - 1 sets - 3 reps - 30-60 sec hold - Standard Plank  - 2 x daily - 7 x weekly - 1 sets - 5 reps - max hold - Side Plank on Elbow  - 2 x daily - 7 x weekly - 1 sets - 5 reps - max hold - Child's Pose Stretch  - 1 x daily - 7 x weekly - 1 sets - 3 reps - 20-30 sec hold - Child's Pose with Sidebending  - 2 x daily - 7 x weekly - 1 sets - 2 reps - 60 sec hold - Standing Glute Med Mobilization with Small Ball on Wall  - 1 x daily - 7 x weekly - 3 sets - 10 reps - Thoracic Mobilization on Foam Roll  - 1 x daily - 7 x weekly - 3 sets - 10 reps - Standing Quadratus Lumborum Stretch with Doorway  -  2 x daily - 7 x weekly - 3 reps - 30 sec hold - Standing Shoulder Row with Anchored Resistance  - 1 x daily - 7 x weekly - 2 sets - 10 reps - 5 sec hold - Scapular Retraction with Resistance Advanced  - 1 x daily - 7 x weekly - 2 sets - 10 reps - 5 sec hold - Isometric Anti-Rotation Press  - 1 x daily - 3-4 x weekly - 2 sets - 10 reps - 3-5 sec hold  Patient Education - TENS Therapy - TENS Unit - Trigger Point Dry  Needling  ASSESSMENT:  CLINICAL IMPRESSION: Pt reports today that he now has more lower back pain rather than the thoracic spine. He continues to demonstrate muscle tension on the L side of the low back in the same thoracolumbar area. He has reported 50% improvement in overall pain. He is now able to tolerate at least an hour of standing w/o increased pain (LTG #6 met) however pain comes in after about 3-4 hours of standing. He is soon coming up on recert, which based on his symptoms would benefit from continued skilled therapy.   OBJECTIVE IMPAIRMENTS decreased activity tolerance, decreased ROM, decreased strength, hypomobility, increased muscle spasms, impaired flexibility, improper body mechanics, postural dysfunction, and pain.   ACTIVITY LIMITATIONS lifting, sitting, standing, and transfers  PARTICIPATION LIMITATIONS: occupation requires frequent position changes  PERSONAL FACTORS  N/A    REHAB POTENTIAL: Excellent  CLINICAL DECISION MAKING: Stable/uncomplicated  EVALUATION COMPLEXITY: Low   GOALS: Goals reviewed with patient? Yes  SHORT TERM GOALS: Target date: 10/20/2021 (Remove Blue Hyperlink)  Patient will be independent with initial HEP.  Baseline:  Goal status: MET    LONG TERM GOALS: Target date: 11/17/2021  (Remove Blue Hyperlink)  Patient will be independent with advanced/ongoing HEP to improve outcomes and carryover.  Baseline:  Goal status: IN PROGRESS  2.  Patient will report 75% improvement in low back pain to improve QOL.  Baseline:  Goal status: IN PROGRESS - 50% improvement 11/11/21  3.  Patient will demonstrate full pain free lumbar ROM to perform ADLs.   Baseline:  Goal status: IN PROGRESS  4.  Patient will demonstrate improved core strength as demonstrated by solid trunk stabilization with MMT of hip flexors and ability to perform high level core exercises such as a plank. Baseline:  Goal status: IN PROGRESS  5.  Patient will report <= to 8%  disabiity on the modified Oswestry to demonstrate improved functional ability.  Baseline:  Goal status: IN PROGRESS   6.  Patient will tolerate standing for >= 1 hour without increased pain in order to perform ADLS and his job requirements. Baseline:  Goal status: MET - 11/11/21, pain after 3-4 hours of standing  7.  Patient to demonstrate ability to achieve and maintain good spinal alignment/posturing and body mechanics needed for daily activities. Baseline: lateral shift Goal status: IN PROGRESS  8. Patient will demonstrate correct form with dead lifts and squats in order to progress functional strengthening in a gym setting. Baseline:  Goal status: IN PROGRESS    PLAN: PT FREQUENCY: 2x/week  PT DURATION: 6 weeks  PLANNED INTERVENTIONS: Therapeutic exercises, Therapeutic activity, Neuromuscular re-education, Patient/Family education, Self Care, Joint mobilization, Dry Needling, Electrical stimulation, Spinal mobilization, Cryotherapy, Moist heat, Taping, Traction, Ionotophoresis 38m/ml Dexamethasone, and Manual therapy.  PLAN FOR NEXT SESSION:  manual/DN to bil lumbar/QL and Rt glutes, focus on core strengthening and functional core activities, dead lifts, lunge with rotations, spinal  mobs prn, body mechanics and ADL modifications    Jaculin Rasmus L Dalilah Curlin, PTA 11/11/2021, 11:56 AM  PHYSICAL THERAPY DISCHARGE SUMMARY  Visits from Start of Care: 9  Current functional level related to goals / functional outcomes:   Refer to above clinical impression and goal assessment for status as of last visit on 11/11/2021.  Patient no-showed for next scheduled visit on 11/16/2021 and canceled final scheduled visit on 11/22/2021 due to illness.  He has not returned to physical therapy in greater than 30 days, therefore will proceed with discharge from PT for this episode.    Remaining deficits:   As above.  Unable to formally assess status at discharge due to failure to return to physical therapy.    Education / Equipment:   HEP, role of dry needling, posture and body mechanics education  Patient agrees to discharge. Patient goals were partially met. Patient is being discharged due to not returning since the last visit.  Percival Spanish, PT, MPT 12/28/21, 11:26 AM  [facility] [address]  [place], [place], [address] Phone: [phone]   Fax:  [phone]

## 2021-11-16 ENCOUNTER — Ambulatory Visit: Payer: 59 | Admitting: Physical Therapy

## 2021-11-22 ENCOUNTER — Ambulatory Visit: Payer: 59 | Admitting: Physical Therapy

## 2021-12-03 ENCOUNTER — Other Ambulatory Visit (HOSPITAL_BASED_OUTPATIENT_CLINIC_OR_DEPARTMENT_OTHER): Payer: Self-pay

## 2021-12-07 ENCOUNTER — Encounter: Payer: Self-pay | Admitting: Family Medicine

## 2021-12-07 ENCOUNTER — Ambulatory Visit (INDEPENDENT_AMBULATORY_CARE_PROVIDER_SITE_OTHER): Payer: 59 | Admitting: Family Medicine

## 2021-12-07 ENCOUNTER — Other Ambulatory Visit (HOSPITAL_BASED_OUTPATIENT_CLINIC_OR_DEPARTMENT_OTHER): Payer: Self-pay

## 2021-12-07 VITALS — BP 123/83 | HR 104 | Resp 16 | Ht 72.0 in | Wt 219.0 lb

## 2021-12-07 DIAGNOSIS — K2100 Gastro-esophageal reflux disease with esophagitis, without bleeding: Secondary | ICD-10-CM

## 2021-12-07 MED ORDER — OMEPRAZOLE 40 MG PO CPDR
40.0000 mg | DELAYED_RELEASE_CAPSULE | Freq: Every day | ORAL | 3 refills | Status: DC
  Filled 2021-12-07: qty 30, 30d supply, fill #0
  Filled 2022-01-07: qty 30, 30d supply, fill #1
  Filled 2022-02-10: qty 30, 30d supply, fill #2
  Filled 2022-03-18: qty 30, 30d supply, fill #3
  Filled 2022-04-25: qty 30, 30d supply, fill #4
  Filled 2022-05-27: qty 30, 30d supply, fill #5
  Filled 2022-07-01: qty 30, 30d supply, fill #6
  Filled 2022-08-04: qty 30, 30d supply, fill #7
  Filled 2022-09-07: qty 30, 30d supply, fill #8
  Filled 2022-10-14: qty 30, 30d supply, fill #9
  Filled 2022-11-09: qty 30, 30d supply, fill #10

## 2021-12-07 NOTE — Assessment & Plan Note (Signed)
Stable on omeprazole 40 mg daily Continue lifestyle management

## 2021-12-07 NOTE — Progress Notes (Signed)
   Established Patient Office Visit  Subjective   Patient ID: Blake Baldwin, male    DOB: 1985/01/20  Age: 36 y.o. MRN: [id number]  Chief Complaint  Patient presents with   Follow-up    Pt is here for medication refills    HPI   GERD: Paitent complains of heartburn. This has been associated with heartburn.  He denies abdominal bloating, belching, chest pain, choking on food, difficulty swallowing, dysphagia, early satiety, fullness after meals, hematemesis, and hoarseness. Symptoms have been present for several years. He denies dysphagia.  He has not lost weight. He denies melena, hematochezia, hematemesis, and coffee ground emesis. Medical therapy in the past has included proton pump inhibitors.      ROS All review of systems negative except what is listed in the HPI    Objective:     BP 123/83   Pulse (!) 104   Resp 16   Ht 6' (1.829 m)   Wt 219 lb (99.3 kg)   SpO2 96%   BMI 29.70 kg/m    Physical Exam Vitals reviewed.  Constitutional:      Appearance: Normal appearance.  Cardiovascular:     Rate and Rhythm: Normal rate and regular rhythm.     Pulses: Normal pulses.     Heart sounds: Normal heart sounds.  Pulmonary:     Effort: Pulmonary effort is normal.     Breath sounds: Normal breath sounds.  Skin:    General: Skin is warm and dry.  Neurological:     Mental Status: He is alert and oriented to person, place, and time.  Psychiatric:        Mood and Affect: Mood normal.        Behavior: Behavior normal.        Thought Content: Thought content normal.        Judgment: Judgment normal.      No results found for any visits on 12/07/21.    The ASCVD Risk score (Arnett DK, et al., 2019) failed to calculate for the following reasons:   The 2019 ASCVD risk score is only valid for ages 42 to 58    Assessment & Plan:   Problem List Items Addressed This Visit       Digestive   GERD (gastroesophageal reflux disease) - Primary    Stable on  omeprazole 40 mg daily Continue lifestyle management       Relevant Medications   omeprazole (PRILOSEC) 40 MG capsule    Return for schedule CPE .    Terrilyn Saver, NP

## 2021-12-11 ENCOUNTER — Other Ambulatory Visit: Payer: Self-pay | Admitting: Family

## 2021-12-11 DIAGNOSIS — K2100 Gastro-esophageal reflux disease with esophagitis, without bleeding: Secondary | ICD-10-CM

## 2022-01-07 ENCOUNTER — Other Ambulatory Visit (HOSPITAL_BASED_OUTPATIENT_CLINIC_OR_DEPARTMENT_OTHER): Payer: Self-pay

## 2022-02-10 ENCOUNTER — Other Ambulatory Visit (HOSPITAL_BASED_OUTPATIENT_CLINIC_OR_DEPARTMENT_OTHER): Payer: Self-pay

## 2022-03-18 ENCOUNTER — Other Ambulatory Visit (HOSPITAL_BASED_OUTPATIENT_CLINIC_OR_DEPARTMENT_OTHER): Payer: Self-pay

## 2022-04-25 ENCOUNTER — Other Ambulatory Visit (HOSPITAL_BASED_OUTPATIENT_CLINIC_OR_DEPARTMENT_OTHER): Payer: Self-pay

## 2022-05-27 ENCOUNTER — Other Ambulatory Visit (HOSPITAL_BASED_OUTPATIENT_CLINIC_OR_DEPARTMENT_OTHER): Payer: Self-pay

## 2022-07-01 ENCOUNTER — Other Ambulatory Visit (HOSPITAL_BASED_OUTPATIENT_CLINIC_OR_DEPARTMENT_OTHER): Payer: Self-pay

## 2022-08-04 ENCOUNTER — Other Ambulatory Visit: Payer: Self-pay

## 2022-08-04 ENCOUNTER — Other Ambulatory Visit (HOSPITAL_BASED_OUTPATIENT_CLINIC_OR_DEPARTMENT_OTHER): Payer: Self-pay

## 2022-08-19 ENCOUNTER — Other Ambulatory Visit (HOSPITAL_BASED_OUTPATIENT_CLINIC_OR_DEPARTMENT_OTHER): Payer: Self-pay

## 2022-08-19 ENCOUNTER — Other Ambulatory Visit (HOSPITAL_COMMUNITY)
Admission: RE | Admit: 2022-08-19 | Discharge: 2022-08-19 | Disposition: A | Discharge status: Home / Self Care | Payer: 59 | Source: Ambulatory Visit | Attending: Physician Assistant | Admitting: Physician Assistant

## 2022-08-19 ENCOUNTER — Encounter: Payer: Self-pay | Admitting: Physician Assistant

## 2022-08-19 ENCOUNTER — Ambulatory Visit: Payer: 59 | Admitting: Physician Assistant

## 2022-08-19 VITALS — BP 126/84 | HR 84 | Temp 98.3°F | Wt 216.0 lb

## 2022-08-19 DIAGNOSIS — Z113 Encounter for screening for infections with a predominantly sexual mode of transmission: Secondary | ICD-10-CM | POA: Diagnosis not present

## 2022-08-19 DIAGNOSIS — A64 Unspecified sexually transmitted disease: Secondary | ICD-10-CM

## 2022-08-19 DIAGNOSIS — L731 Pseudofolliculitis barbae: Secondary | ICD-10-CM

## 2022-08-19 MED ORDER — SULFAMETHOXAZOLE-TRIMETHOPRIM 800-160 MG PO TABS
1.0000 | ORAL_TABLET | Freq: Two times a day (BID) | ORAL | 0 refills | Status: AC
  Filled 2022-08-19: qty 14, 7d supply, fill #0

## 2022-08-19 MED ORDER — MUPIROCIN 2 % EX OINT
1.0000 | TOPICAL_OINTMENT | Freq: Two times a day (BID) | CUTANEOUS | 0 refills | Status: DC
  Filled 2022-08-19: qty 22, 11d supply, fill #0

## 2022-08-19 NOTE — Progress Notes (Signed)
Established patient visit  Patient: Blake Baldwin   DOB: 23-Apr-1985   37 y.o. Male  MRN: [id number] Visit Date: 08/19/2022  Today's healthcare provider: Alfredia Ferguson, PA-C   Cc. Ingrown hair  Subjective    HPI  Pt reports an ingrown hair on his genitals x 2-3 weeks ago. He shaved, and then had anal intercourse when the lesion appeared. Tender, denies drainage.  Denies urinary symptoms.  Medications: Outpatient Medications Prior to Visit  Medication Sig   ibuprofen (ADVIL) 800 MG tablet Take 800 mg by mouth every 8 (eight) hours as needed.   meloxicam (MOBIC) 7.5 MG tablet Take 1 tablet (7.5 mg total) by mouth daily.   omeprazole (PRILOSEC) 40 MG capsule Take 1 capsule (40 mg total) by mouth daily.   No facility-administered medications prior to visit.    Review of Systems  Constitutional:  Negative for fatigue and fever.  Respiratory:  Negative for cough and shortness of breath.   Cardiovascular:  Negative for chest pain, palpitations and leg swelling.  Neurological:  Negative for dizziness and headaches.      Objective    BP 126/84 (BP Location: Left Arm, Patient Position: Sitting, Cuff Size: Normal)   Pulse 84   Temp 98.3 F (36.8 C) (Oral)   Wt 216 lb (98 kg)   SpO2 99%   BMI 29.29 kg/m   Physical Exam Vitals reviewed.  Constitutional:      Appearance: He is not ill-appearing.  HENT:     Head: Normocephalic.  Eyes:     Conjunctiva/sclera: Conjunctivae normal.  Cardiovascular:     Rate and Rhythm: Normal rate.  Pulmonary:     Effort: Pulmonary effort is normal. No respiratory distress.  Genitourinary:    Comments: 5 mm erythematous raised lesion on the pt's left side of his shaft. No other lesions visualized on brief exam, pt did not entirely expose himself.  Not vesicular Neurological:     General: No focal deficit present.     Mental Status: He is alert and oriented to person, place, and time.  Psychiatric:        Mood and Affect: Mood  normal.        Behavior: Behavior normal.      Results for orders placed or performed in visit on 08/19/22  RPR  Result Value Ref Range   RPR Ser Ql NON-REACTIVE NON-REACTIVE  HIV antibody (with reflex)  Result Value Ref Range   HIV 1&2 Ab, 4th Generation NON-REACTIVE NON-REACTIVE  Hepatitis C antibody  Result Value Ref Range   Hepatitis C Ab NON-REACTIVE NON-REACTIVE    Assessment & Plan     1. Ingrown hair Recommending warm compresses, d/t discomfort rx bactrim bid x 7 days, topical bactroban.   - sulfamethoxazole-trimethoprim (BACTRIM DS) 800-160 MG tablet; Take 1 tablet by mouth 2 (two) times daily for 7 days.  Dispense: 14 tablet; Refill: 0 - mupirocin ointment (BACTROBAN) 2 %; Apply 1 Application topically 2 (two) times daily.  Dispense: 22 g; Refill: 0  2. STI (sexually transmitted infection) screening - Urine cytology ancillary only - RPR - HIV antibody (with reflex) - Hepatitis C antibody   Return if symptoms worsen or fail to improve.      I, Alfredia Ferguson, PA-C have reviewed all documentation for this visit. The documentation on  08/22/22   for the exam, diagnosis, procedures, and orders are all accurate and complete.    Alfredia Ferguson, PA-C  [facility]  [place] [phone] (phone) [phone] (fax)  [facility]

## 2022-09-07 ENCOUNTER — Other Ambulatory Visit (HOSPITAL_BASED_OUTPATIENT_CLINIC_OR_DEPARTMENT_OTHER): Payer: Self-pay

## 2022-10-14 ENCOUNTER — Other Ambulatory Visit (HOSPITAL_BASED_OUTPATIENT_CLINIC_OR_DEPARTMENT_OTHER): Payer: Self-pay

## 2022-10-29 ENCOUNTER — Other Ambulatory Visit: Payer: Self-pay

## 2022-10-29 ENCOUNTER — Emergency Department (HOSPITAL_BASED_OUTPATIENT_CLINIC_OR_DEPARTMENT_OTHER)
Admission: EM | Admit: 2022-10-29 | Discharge: 2022-10-29 | Disposition: A | Discharge status: Home / Self Care | Payer: 59 | Attending: Emergency Medicine | Admitting: Emergency Medicine

## 2022-10-29 ENCOUNTER — Emergency Department (HOSPITAL_BASED_OUTPATIENT_CLINIC_OR_DEPARTMENT_OTHER): Payer: 59

## 2022-10-29 DIAGNOSIS — R519 Headache, unspecified: Secondary | ICD-10-CM | POA: Insufficient documentation

## 2022-10-29 DIAGNOSIS — Z1152 Encounter for screening for COVID-19: Secondary | ICD-10-CM | POA: Diagnosis not present

## 2022-10-29 LAB — RESP PANEL BY RT-PCR (RSV, FLU A&B, COVID)  RVPGX2
Influenza A by PCR: NEGATIVE
Influenza B by PCR: NEGATIVE
Resp Syncytial Virus by PCR: NEGATIVE
SARS Coronavirus 2 by RT PCR: NEGATIVE

## 2022-10-29 MED ORDER — ONDANSETRON 4 MG PO TBDP
8.0000 mg | ORAL_TABLET | Freq: Once | ORAL | Status: AC
  Administered 2022-10-29: 8 mg via ORAL
  Filled 2022-10-29: qty 2

## 2022-10-29 MED ORDER — DIPHENHYDRAMINE HCL 12.5 MG/5ML PO ELIX
25.0000 mg | ORAL_SOLUTION | Freq: Once | ORAL | Status: AC
  Administered 2022-10-29: 25 mg via ORAL
  Filled 2022-10-29: qty 10

## 2022-10-29 MED ORDER — KETOROLAC TROMETHAMINE 60 MG/2ML IM SOLN
30.0000 mg | Freq: Once | INTRAMUSCULAR | Status: AC
  Administered 2022-10-29: 30 mg via INTRAMUSCULAR
  Filled 2022-10-29: qty 2

## 2022-10-29 NOTE — ED Triage Notes (Signed)
Patient presents to ED c/o headache that started at 2230 yesterday. Patient reports he attempted to sleep but woke up at 0230 with worsening pain in head. Patient reports taking tylenol 30-45 minutes ago without relief. Patient denies any other symptoms.

## 2022-10-29 NOTE — ED Provider Notes (Signed)
[place] [facility] Provider Note   CSN: [id number] Arrival date & time: 10/29/22  7253     History  Chief Complaint  Patient presents with   Headache    Blake Baldwin is a 37 y.o. male.  The history is provided by the patient.  Headache Pain location:  Frontal Quality:  Dull Radiates to:  Does not radiate Onset quality:  Gradual Duration:  6 hours Timing:  Constant Progression:  Worsening Chronicity:  Recurrent Relieved by:  Nothing Worsened by:  Nothing Ineffective treatments:  Acetaminophen (take 35 minutes prior to arrival) Associated symptoms: no back pain, no blurred vision, no congestion, no cough, no diarrhea, no eye pain, no fatigue, no fever, no focal weakness, no neck pain, no neck stiffness, no numbness, no paresthesias, no seizures, no sore throat, no URI, no visual change and no weakness       Past Medical History:  Diagnosis Date   Bronchitis    Gallstones      Home Medications Prior to Admission medications   Medication Sig Start Date End Date Taking? Authorizing Provider  ibuprofen (ADVIL) 800 MG tablet Take 800 mg by mouth every 8 (eight) hours as needed.    [provider]  meloxicam (MOBIC) 7.5 MG tablet Take 1 tablet (7.5 mg total) by mouth daily. 09/21/21   Sandford Craze, NP  mupirocin ointment (BACTROBAN) 2 % Apply 1 Application topically 2 (two) times daily. 08/19/22   Alfredia Ferguson, PA-C  omeprazole (PRILOSEC) 40 MG capsule Take 1 capsule (40 mg total) by mouth daily. 12/07/21   Clayborne Dana, NP      Allergies    Diclofenac and Dilaudid [hydromorphone hcl]    Review of Systems   Review of Systems  Constitutional:  Negative for fatigue and fever.  HENT:  Negative for congestion, sneezing and sore throat.   Eyes:  Negative for blurred vision, pain and itching.  Respiratory:  Negative for cough.   Gastrointestinal:  Negative for diarrhea.  Musculoskeletal:  Negative for back  pain, neck pain and neck stiffness.  Neurological:  Positive for headaches. Negative for focal weakness, seizures, weakness, numbness and paresthesias.  All other systems reviewed and are negative.   Physical Exam Updated Vital Signs BP (!) 148/100 (BP Location: Right Arm)   Pulse 81   Temp 97.7 F (36.5 C) (Oral)   Resp 16   Ht 6' (1.829 m)   Wt 93 kg   SpO2 100%   BMI 27.80 kg/m  Physical Exam Vitals and nursing note reviewed. Exam conducted with a chaperone present.  Constitutional:      General: He is not in acute distress.    Appearance: He is well-developed. He is not diaphoretic.  HENT:     Head: Normocephalic and atraumatic.     Nose: Nose normal.  Eyes:     Extraocular Movements: Extraocular movements intact.     Conjunctiva/sclera: Conjunctivae normal.     Pupils: Pupils are equal, round, and reactive to light.  Cardiovascular:     Rate and Rhythm: Normal rate and regular rhythm.     Pulses: Normal pulses.     Heart sounds: Normal heart sounds.  Pulmonary:     Effort: Pulmonary effort is normal.     Breath sounds: Normal breath sounds. No wheezing or rales.  Abdominal:     General: Bowel sounds are normal.     Palpations: Abdomen is soft.     Tenderness: There is no  abdominal tenderness. There is no guarding or rebound.  Musculoskeletal:        General: Normal range of motion.     Cervical back: Normal range of motion and neck supple. No rigidity.  Skin:    General: Skin is warm and dry.     Capillary Refill: Capillary refill takes less than 2 seconds.  Neurological:     General: No focal deficit present.     Mental Status: He is alert and oriented to person, place, and time.     Deep Tendon Reflexes: Reflexes normal.  Psychiatric:        Mood and Affect: Mood normal.        Behavior: Behavior normal.     ED Results / Procedures / Treatments   Labs (all labs ordered are listed, but only abnormal results are displayed) Labs Reviewed  RESP PANEL BY  RT-PCR (RSV, FLU A&B, COVID)  RVPGX2    EKG None  Radiology No results found.  Procedures Procedures    Medications Ordered in ED Medications  ketorolac (TORADOL) injection 30 mg (30 mg Intramuscular Given 10/29/22 0358)  diphenhydrAMINE (BENADRYL) 12.5 MG/5ML elixir 25 mg (25 mg Oral Given 10/29/22 0400)  ondansetron (ZOFRAN-ODT) disintegrating tablet 8 mg (8 mg Oral Given 10/29/22 0401)    ED Course/ Medical Decision Making/ A&P                                 Medical Decision Making Headache started at 1030 and got worse   Amount and/or Complexity of Data Reviewed Independent Historian: spouse    Details: See above  External Data Reviewed: notes.    Details: Previous notes reviewed  Labs: ordered.    Details: Negative covid and flu  Radiology: ordered and independent interpretation performed.    Details: Negative CT by me   Risk Prescription drug management. Risk Details: No signs of meningitis.  Head CT is normal, improving on medication.  Likely migranous in origin.  Well appearing with normal exam.  Stable for discharge. Strict return    Final Clinical Impression(s) / ED Diagnoses Final diagnoses:  None   Return for intractable cough, coughing up blood, fevers > 100.4 unrelieved by medication, shortness of breath, intractable vomiting, chest pain, shortness of breath, weakness, numbness, changes in speech, facial asymmetry, abdominal pain, passing out, Inability to tolerate liquids or food, cough, altered mental status or any concerns. No signs of systemic illness or infection. The patient is nontoxic-appearing on exam and vital signs are within normal limits.  I have reviewed the triage vital signs and the nursing notes. Pertinent labs & imaging results that were available during my care of the patient were reviewed by me and considered in my medical decision making (see chart for details). After history, exam, and medical workup I feel the patient has been  appropriately medically screened and is safe for discharge home. Pertinent diagnoses were discussed with the patient. Patient was given return precautions Rx / DC Orders ED Discharge Orders     None         Valyncia Wiens, MD 10/29/22 [id number]

## 2022-11-09 ENCOUNTER — Other Ambulatory Visit (HOSPITAL_BASED_OUTPATIENT_CLINIC_OR_DEPARTMENT_OTHER): Payer: Self-pay

## 2022-12-16 ENCOUNTER — Other Ambulatory Visit (HOSPITAL_BASED_OUTPATIENT_CLINIC_OR_DEPARTMENT_OTHER): Payer: Self-pay

## 2022-12-16 ENCOUNTER — Other Ambulatory Visit: Payer: Self-pay | Admitting: Family Medicine

## 2022-12-16 DIAGNOSIS — K2100 Gastro-esophageal reflux disease with esophagitis, without bleeding: Secondary | ICD-10-CM

## 2022-12-16 IMAGING — US US EXTREM UP *R* LTD
1 series · 12 of 12 positions shown · non-contrast
Comparison: Ultrasound 05/10/2013

CLINICAL DATA: Soft tissue mass at right shoulder for 5-6 years,
increasing in size

EXAM:
ULTRASOUND RIGHT UPPER EXTREMITY LIMITED
TECHNIQUE: Ultrasound examination of the upper extremity soft tissues was
performed in the area of clinical concern.

[Series 1: us extrem up *right* ltd · 12 acquisitions, 12 frames shown]
[im 1/12]
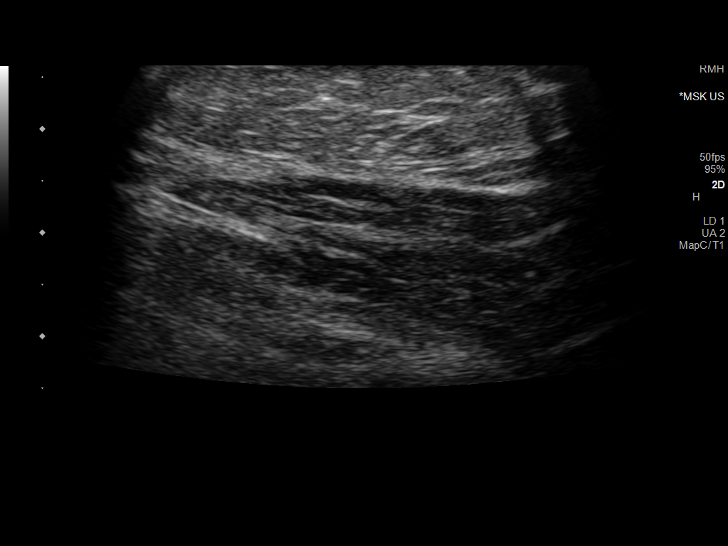
[im 2/12]
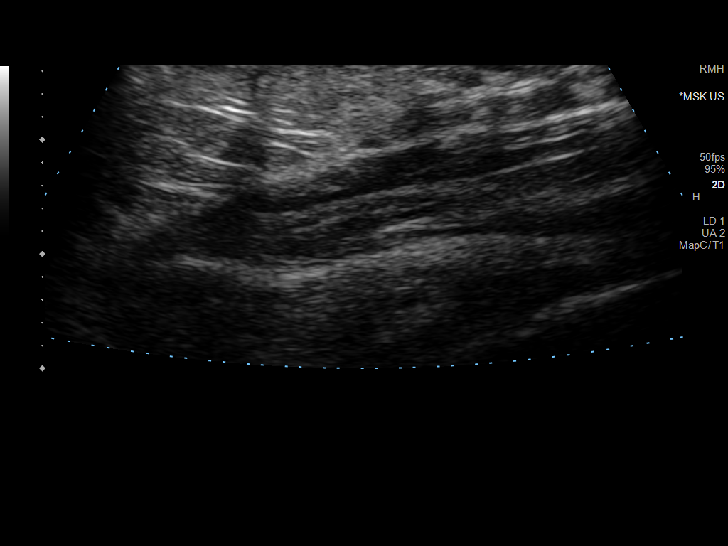
[im 3/12]
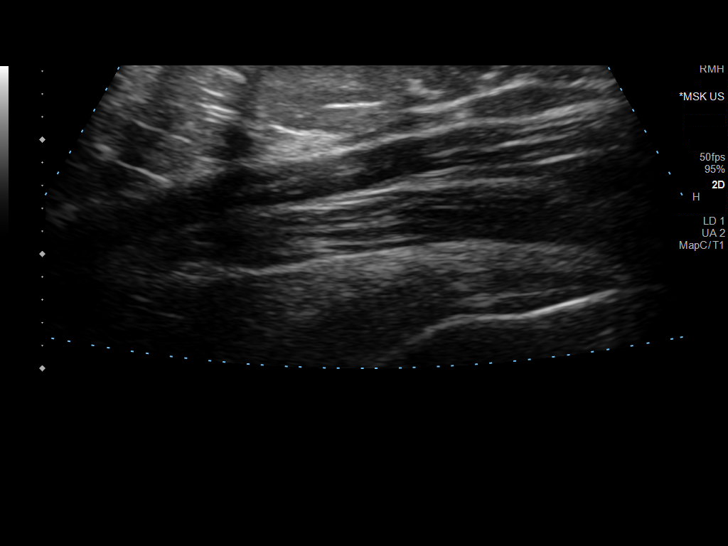
[im 4/12]
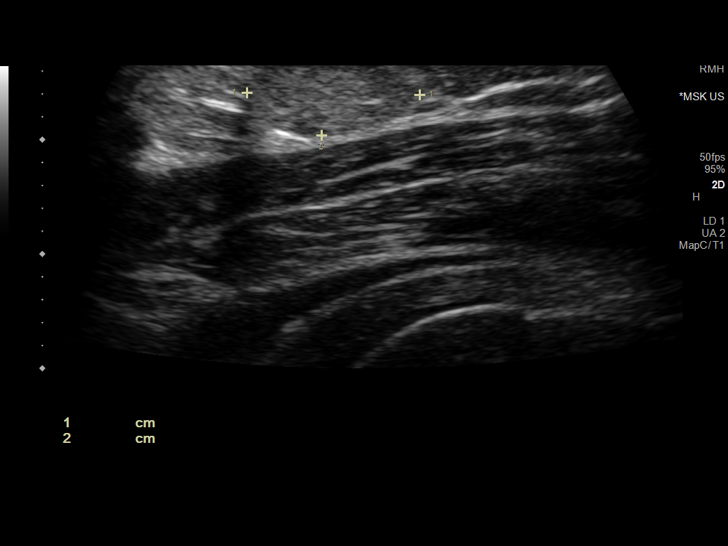
[im 5/12]
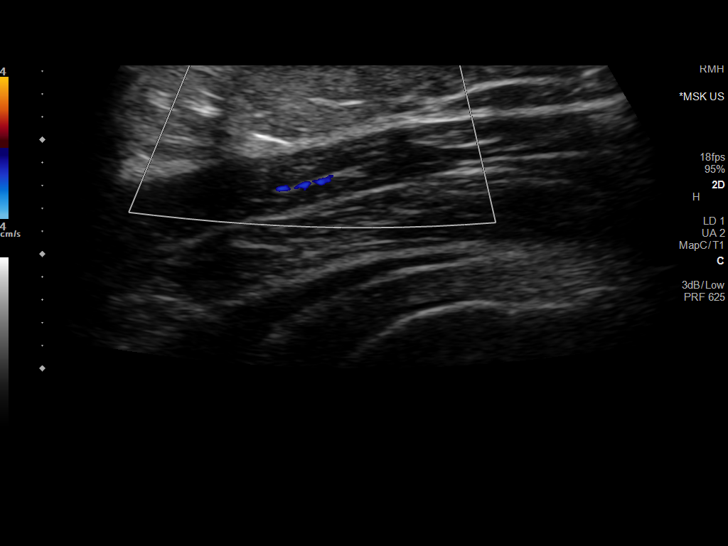
[im 6/12]
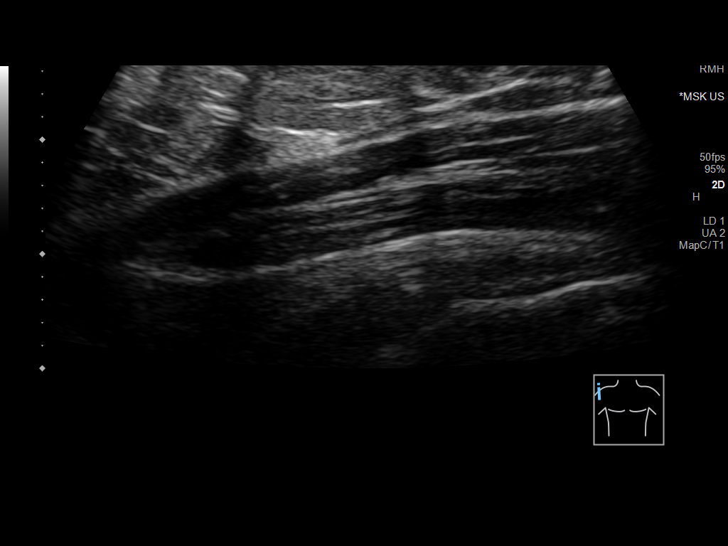
[im 7/12]
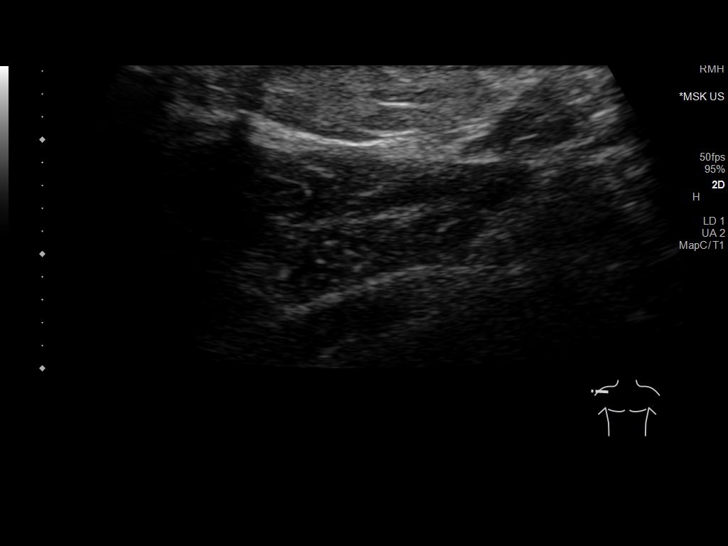
[im 8/12]
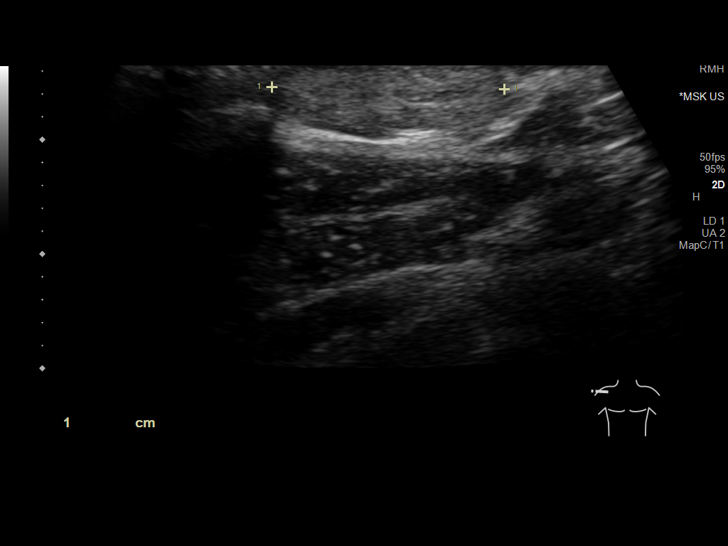
[im 9/12]
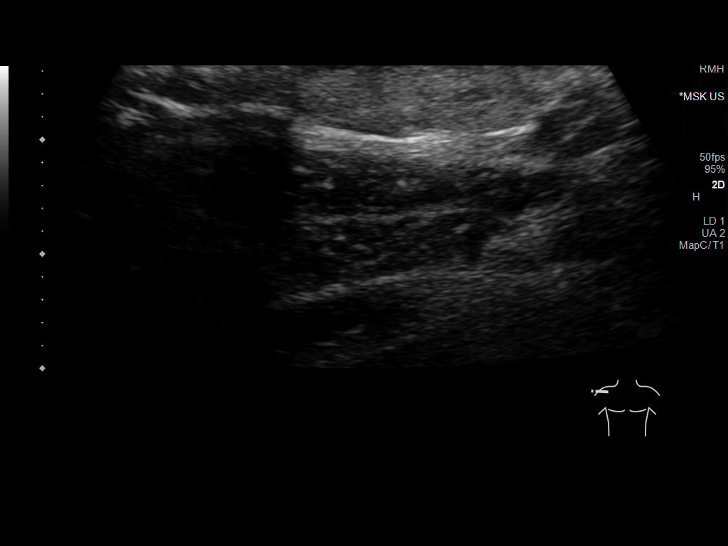
[im 10/12]
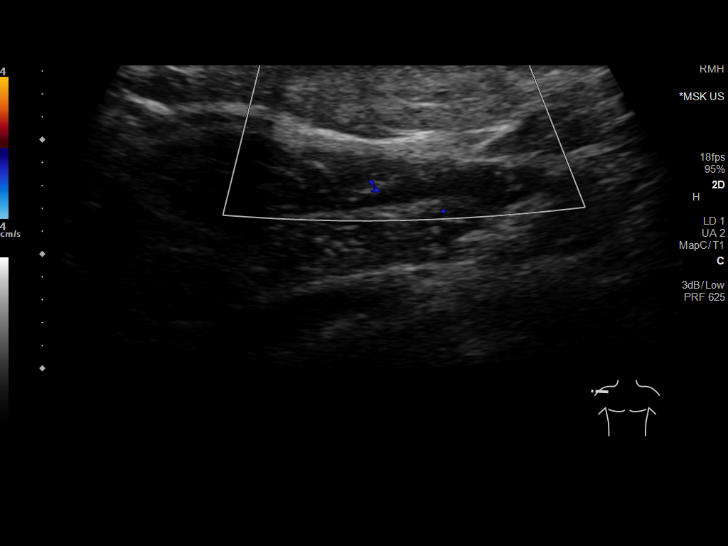
[im 11/12]
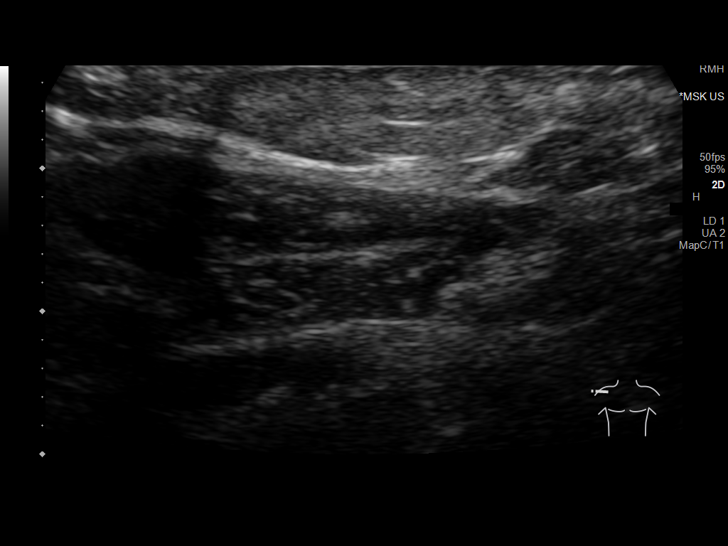
[im 12/12]
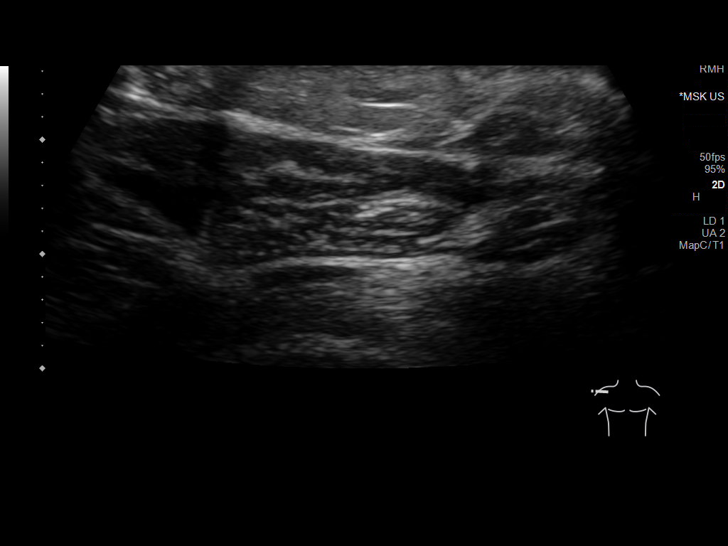

[12 of 12 positions shown; findings below may reference images not displayed]

FINDINGS: Targeted ultrasound was performed of the soft tissues at site of
patient's palpable abnormality. Within the subcutaneous soft tissues
is a well-defined ovoid mass which is isoechoic to background fat
measuring 2.0 x 0.7 x 1.7 cm. No posterior acoustic features. No
internal vascularity. No surrounding edema or hyperemia.
IMPRESSION: Well-defined 2.0 cm nonvascular soft tissue mass within the
subcutaneous soft tissues of the right shoulder. Imaging features
are most compatible with a lipoma.

## 2022-12-16 MED ORDER — OMEPRAZOLE 40 MG PO CPDR
40.0000 mg | DELAYED_RELEASE_CAPSULE | Freq: Every day | ORAL | 3 refills | Status: DC
  Filled 2022-12-16: qty 30, 30d supply, fill #0

## 2022-12-26 ENCOUNTER — Other Ambulatory Visit (HOSPITAL_BASED_OUTPATIENT_CLINIC_OR_DEPARTMENT_OTHER): Payer: Self-pay

## 2023-01-02 ENCOUNTER — Other Ambulatory Visit (HOSPITAL_BASED_OUTPATIENT_CLINIC_OR_DEPARTMENT_OTHER): Payer: Self-pay

## 2023-01-02 ENCOUNTER — Ambulatory Visit (INDEPENDENT_AMBULATORY_CARE_PROVIDER_SITE_OTHER): Payer: 59 | Admitting: Family

## 2023-01-02 ENCOUNTER — Encounter: Payer: Self-pay | Admitting: Family

## 2023-01-02 VITALS — BP 116/62 | HR 98 | Temp 98.6°F | Ht 72.0 in | Wt 216.0 lb

## 2023-01-02 DIAGNOSIS — K2100 Gastro-esophageal reflux disease with esophagitis, without bleeding: Secondary | ICD-10-CM

## 2023-01-02 DIAGNOSIS — D1721 Benign lipomatous neoplasm of skin and subcutaneous tissue of right arm: Secondary | ICD-10-CM

## 2023-01-02 DIAGNOSIS — D179 Benign lipomatous neoplasm, unspecified: Secondary | ICD-10-CM | POA: Diagnosis not present

## 2023-01-02 MED ORDER — OMEPRAZOLE 40 MG PO CPDR
40.0000 mg | DELAYED_RELEASE_CAPSULE | Freq: Every day | ORAL | 4 refills | Status: DC
  Filled 2023-01-02: qty 30, 30d supply, fill #0
  Filled 2023-02-01: qty 30, 30d supply, fill #1
  Filled 2023-03-08: qty 30, 30d supply, fill #2
  Filled 2023-04-13: qty 30, 30d supply, fill #3
  Filled 2023-05-18: qty 30, 30d supply, fill #4
  Filled 2023-06-15: qty 30, 30d supply, fill #5
  Filled 2023-07-13: qty 30, 30d supply, fill #6
  Filled 2023-08-15: qty 30, 30d supply, fill #7
  Filled 2023-09-14: qty 30, 30d supply, fill #8
  Filled 2023-10-11: qty 30, 30d supply, fill #9
  Filled 2023-11-09: qty 30, 30d supply, fill #10
  Filled 2023-12-13: qty 30, 30d supply, fill #11

## 2023-01-02 NOTE — Progress Notes (Signed)
Subjective:     Patient ID: Blake Baldwin, male    DOB: 11-20-1985, 37 y.o.   MRN: [id number]  Chief Complaint  Patient presents with   Gastroesophageal Reflux    Here for follow up, needs refill    HPI  Discussed the use of AI scribe software for clinical note transcription with the patient, who gave verbal consent to proceed.  History of Present Illness   The patient, with a history of acid reflux, presents with increased stress due to personal and professional issues. He is currently going through a divorce and dealing with the aftermath of a recent pregnancy termination by the woman he was dating. He is still living with his ex-spouse, which is causing additional stress. He is also dissatisfied with his work at [employer] and is looking forward to retirement in a year and a half.  The patient's acid reflux symptoms have worsened since he ran out of omeprazole a couple of weeks ago. Over-the-counter omeprazole and Tums have provided some relief, but the patient still experiences some acid reflux. He has been trying to reduce his soda intake, but has found it difficult due to his work schedule and inability to drink coffee.  The patient also mentions his right shoulder lipoma. He has been referred to [facility] three times for this issue, but states he has not received a call back. He expresses a desire to have the mass removed.          Health Maintenance Due  Topic Date Due   COVID-19 Vaccine (1 - 2024-25 season) Never done    Past Medical History:  Diagnosis Date   Bronchitis    Gallstones     Past Surgical History:  Procedure Laterality Date   APPENDECTOMY     CHOLECYSTECTOMY      Family History  Problem Relation Age of Onset   Diabetes Mother    Hypertension Mother    Other Mother        hyperaldosteronism and padgett's disease   Asthma Mother    Heart disease Maternal Aunt    Diabetes Maternal Aunt    Diabetes Maternal Uncle    Diabetes  Cousin     Social History   Socioeconomic History   Marital status: Divorced    Spouse name: Not on file   Number of children: Not on file   Years of education: Not on file   Highest education level: Not on file  Occupational History   Not on file  Tobacco Use   Smoking status: Former    Current packs/day: 0.00    Types: Cigarettes    Quit date: 11/25/2015    Years since quitting: 7.1   Smokeless tobacco: Former  Building services engineer status: Never Used  Substance and Sexual Activity   Alcohol use: Yes    Comment: occ   Drug use: No   Sexual activity: Not on file  Other Topics Concern   Not on file  Social History Narrative   In middle of divorce   Has 2 children   Works at [employer] ex   Social Drivers of Corporate investment banker Strain: Not on file  Food Insecurity: Not on file  Transportation Needs: Not on file  Physical Activity: Not on file  Stress: Not on file  Social Connections: Unknown (05/18/2021)   Received from [facility], [facility]   Social Network    Social Network: Not on file  Intimate Partner Violence:  Unknown (04/14/2021)   Received from [facility], [facility]   HITS    Physically Hurt: Not on file    Insult or Talk Down To: Not on file    Threaten Physical Harm: Not on file    Scream or Curse: Not on file    Outpatient Medications Prior to Visit  Medication Sig Dispense Refill   ibuprofen (ADVIL) 800 MG tablet Take 800 mg by mouth every 8 (eight) hours as needed.     omeprazole (PRILOSEC) 40 MG capsule Take 1 capsule (40 mg total) by mouth daily. 90 capsule 3   meloxicam (MOBIC) 7.5 MG tablet Take 1 tablet (7.5 mg total) by mouth daily. 14 tablet 0   mupirocin ointment (BACTROBAN) 2 % Apply 1 Application topically 2 (two) times daily. 22 g 0   No facility-administered medications prior to visit.    Allergies  Allergen Reactions   Diclofenac     anxiety   Dilaudid [Hydromorphone Hcl] Itching and Rash    ROS    See  HPI Objective:    Physical Exam Constitutional:      General: He is not in acute distress.    Appearance: He is well-developed.  HENT:     Head: Normocephalic and atraumatic.  Cardiovascular:     Rate and Rhythm: Normal rate.  Pulmonary:     Effort: Pulmonary effort is normal.  Musculoskeletal:     Comments: Lipoma noted right posterior shoulder  Skin:    General: Skin is warm and dry.  Neurological:     Mental Status: He is alert and oriented to person, place, and time.  Psychiatric:        Behavior: Behavior normal.        Thought Content: Thought content normal.      BP 116/62 (BP Location: Right Arm, Patient Position: Sitting, Cuff Size: Large)   Pulse 98   Temp 98.6 F (37 C) (Oral)   Ht 6' (1.829 m)   Wt 216 lb (98 kg)   BMI 29.29 kg/m  Wt Readings from Last 3 Encounters:  01/02/23 216 lb (98 kg)  10/29/22 205 lb (93 kg)  08/19/22 216 lb (98 kg)       Assessment & Plan:   Problem List Items Addressed This Visit       Unprioritized   Lipoma of right shoulder    Reports occasional discomfort, possibly due to nerve impingement. Previous ultrasound confirmed fatty tissue. Multiple unsuccessful attempts to schedule surgical consultation. -Refer to [facility] again for evaluation and possible excision. Patient to follow up if no contact from surgical office. He was given their number to call.      GERD (gastroesophageal reflux disease)    Reports increased symptoms since running out of omeprazole a few weeks ago. Over-the-counter omeprazole and Tums provide some relief but not as effective as prescription strength. -Refill prescription for omeprazole. -Encourage lifestyle modifications such as reducing soda intake.      Relevant Medications   omeprazole (PRILOSEC) 40 MG capsule   Other Visit Diagnoses       Lipoma, unspecified site    -  Primary   Relevant Orders   Ambulatory referral to General Surgery       I have discontinued  Govanni L. Calzadilla's meloxicam and mupirocin ointment. I am also having him maintain his ibuprofen and omeprazole.  Meds ordered this encounter  Medications   omeprazole (PRILOSEC) 40 MG capsule    Sig: Take 1 capsule (40  mg total) by mouth daily.    Dispense:  90 capsule    Refill:  4

## 2023-01-02 NOTE — Patient Instructions (Addendum)
[facility] [phone]

## 2023-01-02 NOTE — Assessment & Plan Note (Signed)
  Reports occasional discomfort, possibly due to nerve impingement. Previous ultrasound confirmed fatty tissue. Multiple unsuccessful attempts to schedule surgical consultation. -Refer to [facility] again for evaluation and possible excision. Patient to follow up if no contact from surgical office. He was given their number to call.

## 2023-01-02 NOTE — Assessment & Plan Note (Signed)
  Reports increased symptoms since running out of omeprazole a few weeks ago. Over-the-counter omeprazole and Tums provide some relief but not as effective as prescription strength. -Refill prescription for omeprazole. -Encourage lifestyle modifications such as reducing soda intake.

## 2023-01-19 ENCOUNTER — Other Ambulatory Visit: Payer: Self-pay | Admitting: General Surgery

## 2023-02-01 ENCOUNTER — Other Ambulatory Visit (HOSPITAL_BASED_OUTPATIENT_CLINIC_OR_DEPARTMENT_OTHER): Payer: Self-pay

## 2023-03-08 ENCOUNTER — Other Ambulatory Visit (HOSPITAL_BASED_OUTPATIENT_CLINIC_OR_DEPARTMENT_OTHER): Payer: Self-pay

## 2023-04-03 ENCOUNTER — Other Ambulatory Visit (HOSPITAL_BASED_OUTPATIENT_CLINIC_OR_DEPARTMENT_OTHER): Payer: Self-pay

## 2023-04-03 ENCOUNTER — Ambulatory Visit: Payer: Self-pay

## 2023-04-03 ENCOUNTER — Ambulatory Visit (INDEPENDENT_AMBULATORY_CARE_PROVIDER_SITE_OTHER): Admitting: Medical

## 2023-04-03 ENCOUNTER — Encounter: Payer: Self-pay | Admitting: Medical

## 2023-04-03 ENCOUNTER — Telehealth: Payer: Self-pay | Admitting: Medical

## 2023-04-03 VITALS — BP 128/70 | HR 91 | Temp 98.1°F | Resp 18 | Ht 72.0 in | Wt 211.0 lb

## 2023-04-03 DIAGNOSIS — R5383 Other fatigue: Secondary | ICD-10-CM | POA: Diagnosis not present

## 2023-04-03 DIAGNOSIS — R591 Generalized enlarged lymph nodes: Secondary | ICD-10-CM

## 2023-04-03 DIAGNOSIS — L749 Eccrine sweat disorder, unspecified: Secondary | ICD-10-CM | POA: Diagnosis not present

## 2023-04-03 LAB — COMPREHENSIVE METABOLIC PANEL
ALT: 19 U/L (ref 0–53)
AST: 11 U/L (ref 0–37)
Albumin: 4.4 g/dL (ref 3.5–5.2)
Alkaline Phosphatase: 70 U/L (ref 39–117)
BUN: 14 mg/dL (ref 6–23)
CO2: 27 meq/L (ref 19–32)
Calcium: 9.3 mg/dL (ref 8.4–10.5)
Chloride: 106 meq/L (ref 96–112)
Creatinine, Ser: 0.75 mg/dL (ref 0.40–1.50)
GFR: 115.82 mL/min (ref >60.00)
Glucose, Bld: 109 mg/dL — ABNORMAL HIGH (ref 70–99)
Potassium: 4 meq/L (ref 3.5–5.1)
Sodium: 140 meq/L (ref 135–145)
Total Bilirubin: 0.5 mg/dL (ref 0.2–1.2)
Total Protein: 6.9 g/dL (ref 6.0–8.3)

## 2023-04-03 LAB — CBC WITH DIFFERENTIAL/PLATELET
Basophils Absolute: 0 10*3/uL (ref 0.0–0.1)
Basophils Relative: 0.5 % (ref 0.0–3.0)
Eosinophils Absolute: 0.4 10*3/uL (ref 0.0–0.7)
Eosinophils Relative: 8.5 % — ABNORMAL HIGH (ref 0.0–5.0)
HCT: 46.9 % (ref 39.0–52.0)
Hemoglobin: 15.8 g/dL (ref 13.0–17.0)
Lymphocytes Relative: 25.5 % (ref 12.0–46.0)
Lymphs Abs: 1.3 10*3/uL (ref 0.7–4.0)
MCHC: 33.8 g/dL (ref 30.0–36.0)
MCV: 93.2 fl (ref 78.0–100.0)
Monocytes Absolute: 0.4 10*3/uL (ref 0.1–1.0)
Monocytes Relative: 7 % (ref 3.0–12.0)
Neutro Abs: 3.1 10*3/uL (ref 1.4–7.7)
Neutrophils Relative %: 58.5 % (ref 43.0–77.0)
Platelets: 174 10*3/uL (ref 150.0–400.0)
RBC: 5.04 Mil/uL (ref 4.22–5.81)
RDW: 12.9 % (ref 11.5–15.5)
WBC: 5.2 10*3/uL (ref 4.0–10.5)

## 2023-04-03 LAB — TSH: TSH: 0.54 u[IU]/mL (ref 0.35–5.50)

## 2023-04-03 LAB — VITAMIN B12: Vitamin B-12: 242 pg/mL (ref 211–911)

## 2023-04-03 MED ORDER — CEPHALEXIN 500 MG PO CAPS
500.0000 mg | ORAL_CAPSULE | Freq: Two times a day (BID) | ORAL | 0 refills | Status: DC
  Filled 2023-04-03: qty 20, 10d supply, fill #0

## 2023-04-03 NOTE — Telephone Encounter (Signed)
 Copied from CRM [id number]. Topic: Clinical - Red Word Triage >> Apr 03, 2023  7:50 AM Blake Baldwin wrote: Red Word that prompted transfer to Nurse Triage: Swollen lymph nodes under arm pits    Chief Complaint: Swollen lymph nodes to both arm pits, tender. Symptoms: Above Frequency: Last Thursday Pertinent Negatives: Patient denies any other symptoms  Disposition: [] ED /[] Urgent Care (no appt availability in office) / [x] Appointment(In office/virtual)/ []  [facility]/ [] Home Care/ [] Refused Recommended Disposition /[] [facility]/ []  Follow-up with PCP Additional Notes: Agrees with appointment.  Reason for Disposition  [1] Very tender to the touch AND [2] no fever  Answer Assessment - Initial Assessment Questions 1. LOCATION: "Where is the swollen node located?" "Is the matching node on the other side of the body also swollen?"      Both arm pits 2. SIZE: "How big is the node?" (e.g., inches or centimeters; or compared to common objects such as pea, bean, marble, golf ball)      Left side - 2 peas 3. ONSET: "When did the swelling start?"      Thursday 4. NECK NODES: "Is there a sore throat, runny nose or other symptoms of a cold?"      No 5. GROIN OR ARMPIT NODES: "Is there a sore, scratch, cut or painful red area on that arm or leg?"      No 6. FEVER: "Do you have a fever?" If Yes, ask: "What is it, how was it measured, and when did it start?"      No 7. CAUSE: "What do you think is causing the swollen lymph nodes?"     Unsure 8. OTHER SYMPTOMS: "Do you have any other symptoms?"     No 9. PREGNANCY: "Is there any chance you are pregnant?" "When was your last menstrual period?"     N/a  Protocols used: Lymph Nodes - Swollen-A-AH

## 2023-04-03 NOTE — Telephone Encounter (Signed)
 Efraim Kaufmann,   I saw Blake Baldwin today. Sent you his note. Wanted you to review of recent history/presentation. Asking him to follow up with you in 10-14 days.   Thanks, Esperanza Richters, PA-C

## 2023-04-03 NOTE — Patient Instructions (Addendum)
 Bilateral axillary lymphadenopathy(possible vs below). left side greater than rt. Palpable knots under both axillae, left side more tender. Differential includes sebaceous cysts, lipomas, lymph nodes. Antibiotics prescribed to assess response and need for further workup. - Prescribed Keflex 500 mg twice daily for 10 days with informed consent. - Ordered CBC to assess infection fighting cells. - Scheduled follow-up in 10-14 days with [name] for potential additional workup.(possible [place])  Fatigue Chronic fatigue likely due to night shift work with inadequate sleep. Transitioning off night shifts may improve symptoms. -will add tsh, cmp and b12 to labs.  Family history of cancer. Type unknown.  Maternal family history of cancer, no known history for biological father due to adoption.   Follow up 10-14 days with [name] or sooner if needed

## 2023-04-03 NOTE — Progress Notes (Signed)
 Subjective:    Patient ID: Blake Baldwin, male    DOB: 11-Sep-1985, 38 y.o.   MRN: [id number]  HPI  Discussed the use of AI scribe software for clinical note transcription with the patient, who gave verbal consent to proceed.  History of Present Illness   Blake Baldwin is a 38 year old male who presents with bilateral axillary lumps.  He noticed lumps under both armpits last Thursday while at work. The lump on the left side remains tender, whereas the one on the right is smaller and less sensitive. The left lump is painful when stretching. No associated rashes, fevers, chills, or sweats, although he mentions sweating during sleep, which he attributes to his sleep environment.  He has been working night shifts consistently for almost five years, contributing to his fatigue. He typically sleeps from around 6:00 AM to 10:30 AM, averaging four to four and a half hours of sleep per night. He feels tired and worn down, which others have also noticed.  He reports waking up drenched in sweat, likely due to heavy blankets and room temperature. No fever has been documented.  He occasionally smokes marijuana but quit smoking cigarettes at age 72 after smoking for approximately 10 to 12 years, averaging six to seven cigarettes a day.  His mother and uncle on his mother's side have had cancer. He is unaware of his biological father's medical history as he was adopted. He is not aware of what type of cancer they may have had.         Review of Systems  Constitutional:  Positive for fatigue. Negative for chills and fever.  HENT:  Negative for congestion and ear pain.   Respiratory:  Negative for cough, choking and shortness of breath.   Cardiovascular:  Negative for chest pain and palpitations.  Gastrointestinal:  Negative for abdominal pain and constipation.  Musculoskeletal:  Negative for back pain.  Skin:  Negative for pallor.       See hpi.  Hematological:  Negative for adenopathy.   Psychiatric/Behavioral:  Negative for behavioral problems and dysphoric mood.    Past Medical History:  Diagnosis Date   Bronchitis    Gallstones      Social History   Socioeconomic History   Marital status: Divorced    Spouse name: Not on file   Number of children: Not on file   Years of education: Not on file   Highest education level: Not on file  Occupational History   Not on file  Tobacco Use   Smoking status: Former    Current packs/day: 0.00    Types: Cigarettes    Quit date: 11/25/2015    Years since quitting: 7.3   Smokeless tobacco: Former  Building services engineer status: Never Used  Substance and Sexual Activity   Alcohol use: Yes    Comment: occ   Drug use: No   Sexual activity: Not on file  Other Topics Concern   Not on file  Social History Narrative   In middle of divorce   Has 2 children   Works at [employer] ex   Social Drivers of Corporate investment banker Strain: Not on file  Food Insecurity: Not on file  Transportation Needs: Not on file  Physical Activity: Not on file  Stress: Not on file  Social Connections: Unknown (05/18/2021)   Received from [facility], [facility]   Social Network    Social Network: Not on file  Intimate Partner  Violence: Unknown (04/14/2021)   Received from [facility], [facility]   HITS    Physically Hurt: Not on file    Insult or Talk Down To: Not on file    Threaten Physical Harm: Not on file    Scream or Curse: Not on file    Past Surgical History:  Procedure Laterality Date   APPENDECTOMY     CHOLECYSTECTOMY      Family History  Problem Relation Age of Onset   Diabetes Mother    Hypertension Mother    Other Mother        hyperaldosteronism and padgett's disease   Asthma Mother    Heart disease Maternal Aunt    Diabetes Maternal Aunt    Diabetes Maternal Uncle    Diabetes Cousin     Allergies  Allergen Reactions   Diclofenac     anxiety   Dilaudid [Hydromorphone Hcl] Itching and Rash     Current Outpatient Medications on File Prior to Visit  Medication Sig Dispense Refill   ibuprofen (ADVIL) 800 MG tablet Take 800 mg by mouth every 8 (eight) hours as needed.     omeprazole (PRILOSEC) 40 MG capsule Take 1 capsule (40 mg total) by mouth daily. 90 capsule 4   No current facility-administered medications on file prior to visit.    BP 128/70   Pulse 91   Temp 98.1 F (36.7 C)   Resp 18   Ht 6' (1.829 m)   Wt 211 lb (95.7 kg)   SpO2 96%   BMI 28.62 kg/m        Objective:   Physical Exam  General Mental Status- Alert. General Appearance- Not in acute distress.   Skin No redness or rashes to arms.   Neck Carotid Arteries- Normal color. Moisture- Normal Moisture. No carotid bruits. No JVD.  Chest and Lung Exam Auscultation: Breath Sounds:-CTA  Cardiovascular Auscultation:Rythm- RRR Murmurs & Other Heart Sounds:Auscultation of the heart reveals- No Murmurs.  Abdomen Inspection:-Inspeection Normal. Palpation/Percussion:Note:No mass. Palpation and Percussion of the abdomen reveal- Non Tender, Non Distended + BS, no rebound or guarding.   Neurologic Cranial Nerve exam:- CN III-XII intact(No nystagmus), symmetric smile. Strength:- 5/5 equal and symmetric strength both upper and lower extremities.   Axillary area-  rt side small pea sized probable lymph node skin colore normal. No rash or obvious hydradenitis suppurative. Left side 2.5 cm approximant lymph node vs lipoma vs sebaceous cyst. Skin no red, warm or indurated. No dc. No hydradenitis.  Chest- bilateral small symettric gynecomastia. Otherwise no masses or lumps.  Lymph node exam- no lymphadenopathy of neck or supraclavicular region.     Assessment & Plan:   Assessment and Plan    Patient Instructions  Bilateral axillary lymphadenopathy(possible vs below). left side greater than rt. Palpable knots under both axillae, left side more tender. Differential includes sebaceous cysts, lipomas,  lymph nodes. Antibiotics prescribed to assess response and need for further workup. - Prescribed Keflex 500 mg twice daily for 10 days with informed consent. - Ordered CBC to assess infection fighting cells. - Scheduled follow-up in 10-14 days with [name] for potential additional workup.(possible [place])  Fatigue Chronic fatigue likely due to night shift work with inadequate sleep. Transitioning off night shifts may improve symptoms. -will add tsh, cmp and b12 to labs.  Family history of cancer. Type unknown.  Maternal family history of cancer, no known history for biological father due to adoption.   Follow up 10-14 days with [name] or sooner if  needed

## 2023-04-04 ENCOUNTER — Telehealth: Payer: Self-pay | Admitting: Emergency Medicine

## 2023-04-04 NOTE — Telephone Encounter (Signed)
 Copied from CRM [id number]. Topic: Clinical - Lab/Test Results >> Apr 04, 2023 11:05 AM Almira Coaster wrote: Reason for CRM: Patient received a MyChart message with lab results, advised of message Esperanza Richters sent. Patient will take B12 over the counter vitamins and will call back to schedule follow up appointment.

## 2023-04-13 ENCOUNTER — Other Ambulatory Visit (HOSPITAL_BASED_OUTPATIENT_CLINIC_OR_DEPARTMENT_OTHER): Payer: Self-pay

## 2023-04-24 ENCOUNTER — Ambulatory Visit: Payer: Self-pay | Admitting: Family

## 2023-04-24 NOTE — Telephone Encounter (Signed)
 Copied from CRM [id number]. Topic: Appointments - Appointment Scheduling >> Apr 24, 2023  8:33 AM Emylou G wrote: Slightly inflammed in armpit.. did make an appt for f/u.. says this is tender for the area?  Is there anything can be done?   Chief Complaint: swollen lymph node Symptoms: tenderness and swelling Frequency: constant Pertinent Negatives: Patient denies fever Disposition: [] ED /[] Urgent Care (no appt availability in office) / [x] Appointment(In office/virtual)/ []  [facility]/ [] Home Care/ [] Refused Recommended Disposition /[] [facility]/ []  Follow-up with PCP Additional Notes: Just finished antibiotics and got a little better but the right side now tender to touch. Rescheduled 4/21 f/u to 4/15, pt expressed his concern for cancer due to family history Reason for Disposition  [1] Very tender to the touch AND [2] no fever  Answer Assessment - Initial Assessment Questions 1. LOCATION: "Where is the swollen node located?" "Is the matching node on the other side of the body also swollen?"      Right armpit side, there was one on left but the right is bigger  2. SIZE: "How big is the node?" (e.g., inches or centimeters; or compared to common objects such as pea, bean, marble, golf ball)      Size of a pea  3. ONSET: "When did the swelling start?"      Over the weekend  4. NECK NODES: "Is there a sore throat, runny nose or other symptoms of a cold?"      N/a  5. GROIN OR ARMPIT NODES: "Is there a sore, scratch, cut or painful red area on that arm or leg?"      No  6. FEVER: "Do you have a fever?" If Yes, ask: "What is it, how was it measured, and when did it start?"      No  7. CAUSE: "What do you think is causing the swollen lymph nodes?"     Unsure of cause  8. OTHER SYMPTOMS: "Do you have any other symptoms?"     No  9. PREGNANCY: "Is there any chance you are pregnant?" "When was your last menstrual period?"     N/a  Protocols used: Lymph Nodes -  Swollen-A-AH

## 2023-04-25 ENCOUNTER — Ambulatory Visit (INDEPENDENT_AMBULATORY_CARE_PROVIDER_SITE_OTHER): Admitting: Internal Medicine

## 2023-04-25 ENCOUNTER — Encounter: Payer: Self-pay | Admitting: Internal Medicine

## 2023-04-25 VITALS — BP 120/78 | HR 78 | Temp 98.1°F | Resp 16 | Ht 72.0 in | Wt 212.1 lb

## 2023-04-25 DIAGNOSIS — R591 Generalized enlarged lymph nodes: Secondary | ICD-10-CM

## 2023-04-25 NOTE — Patient Instructions (Signed)
 Please see your primary doctor for a  physical exam in 1 or 2 months.  Call sooner if fever, chills, weight loss, if the lymph nodes get red-warm-very tender.

## 2023-04-25 NOTE — Progress Notes (Signed)
   Subjective:    Patient ID: Blake Baldwin, male    DOB: 1985/09/23, 38 y.o.   MRN: [id number]  DOS:  04/25/2023 Type of visit - description: Acute visit  Sx started ~ 03/25/2023 04/03/2023: Seen at this office, had bilateral axillary lumps. Also complained of chronic fatigue. Left lump was painful, on exam he had a 2.5 cm lump, without inflammation.  Rx Keflex, CBC and other labs satisfactory  He is here because he continued to be concerned. At this point he feels a lump on each armpit.  Denies weight loss, night sweats. No nausea or vomiting. No cough. No recent vaccines applied to the shoulder.  Review of Systems See above   Past Medical History:  Diagnosis Date   Bronchitis    Gallstones     Past Surgical History:  Procedure Laterality Date   APPENDECTOMY     CHOLECYSTECTOMY      Current Outpatient Medications  Medication Instructions   cephALEXin (KEFLEX) 500 mg, Oral, 2 times daily   ibuprofen (ADVIL) 800 mg, Every 8 hours PRN   omeprazole (PRILOSEC) 40 mg, Oral, Daily       Objective:   Physical Exam BP 120/78   Pulse 78   Temp 98.1 F (36.7 C) (Oral)   Resp 16   Ht 6' (1.829 m)   Wt 212 lb 2 oz (96.2 kg)   SpO2 97%   BMI 28.77 kg/m  General:   Well developed, NAD, BMI noted. HEENT:  Normocephalic . Face symmetric, atraumatic Neck: No thyromegaly Lymphatic system: Neck, suprapubic areas, groins: No lymph nodes that I can tell Underarm: He has 1/2 cm, mobile, not warm, not tender lymph node on each side. Lungs:  CTA B Normal respiratory effort, no intercostal retractions, no accessory muscle use. Heart: RRR,  no murmur.  Lower extremities: no pretibial edema bilaterally  Skin: Not pale. Not jaundice Neurologic:  alert & oriented X3.  Speech normal, gait appropriate for age and unassisted Psych--  Cognition and judgment appear intact.  Cooperative with normal attention span and concentration.  Behavior appropriate. No anxious or  depressed appearing.      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  Excision pending due to cost.  Presents with:  Lymphadenopathy: Previously , the patient had a 2.5 cm lump at the left arm.  When was seen few days ago, took antibiotics.  Now has non-pathology lymph nodes- by clinical standards- in both armpits , see physical exam. The patient is extremely worried about this being something serious. I explained that at this point there is no evidence of a major infection, early cancer or other serious condition. He felt somewhat reassured.  Recommend to see PCP for a physical exam in 1 or 2 months.  Sooner if needed, see AVS. Anxiety: As above

## 2023-05-01 ENCOUNTER — Ambulatory Visit: Admitting: Family Medicine

## 2023-05-18 ENCOUNTER — Other Ambulatory Visit (HOSPITAL_BASED_OUTPATIENT_CLINIC_OR_DEPARTMENT_OTHER): Payer: Self-pay

## 2023-05-25 ENCOUNTER — Other Ambulatory Visit (HOSPITAL_COMMUNITY)
Admission: RE | Admit: 2023-05-25 | Discharge: 2023-05-25 | Disposition: A | Discharge status: Home / Self Care | Source: Ambulatory Visit | Attending: Family Medicine | Admitting: Family Medicine

## 2023-05-25 ENCOUNTER — Encounter: Payer: Self-pay | Admitting: Family Medicine

## 2023-05-25 ENCOUNTER — Ambulatory Visit (INDEPENDENT_AMBULATORY_CARE_PROVIDER_SITE_OTHER): Admitting: Family Medicine

## 2023-05-25 VITALS — BP 129/76 | HR 78 | Ht 72.0 in | Wt 212.0 lb

## 2023-05-25 DIAGNOSIS — Z113 Encounter for screening for infections with a predominantly sexual mode of transmission: Secondary | ICD-10-CM | POA: Insufficient documentation

## 2023-05-25 NOTE — Progress Notes (Signed)
   Acute Office Visit  Subjective:     Patient ID: Blake Baldwin, male    DOB: 03/26/1985, 38 y.o.   MRN: [id number]  Chief Complaint  Patient presents with   Medical Management of Chronic Issues    HPI Patient is in today for STD screening.   Discussed the use of AI scribe software for clinical note transcription with the patient, who gave verbal consent to proceed.  History of Present Illness Blake Baldwin is a 38 year old male who presents for routine STD screening.  He is sexually active and recently became single, prompting him to seek testing. He has no current symptoms but wants to ensure he is well-informed about his health status.  He recalls a previous incident where he was tested for herpes due to an ingrown hair, which was initially suspected to be herpes. However, his blood tests for HSV-1 and HSV-2 in 2017 were negative. He has not had any symptoms since then and is not currently experiencing any symptoms.  He prefers urine tests over swabs due to discomfort with the latter.  No current symptoms related to sexually transmitted infections.        ROS All review of systems negative except what is listed in the HPI      Objective:    BP 129/76   Pulse 78   Ht 6' (1.829 m)   Wt 212 lb (96.2 kg)   SpO2 98%   BMI 28.75 kg/m    Physical Exam Vitals reviewed.  Constitutional:      Appearance: Normal appearance.  Neurological:     Mental Status: He is alert and oriented to person, place, and time.  Psychiatric:        Mood and Affect: Mood normal.        Behavior: Behavior normal.        Thought Content: Thought content normal.        Judgment: Judgment normal.     No results found for any visits on 05/25/23.      Assessment & Plan:   Problem List Items Addressed This Visit   None Visit Diagnoses       Routine screening for STI (sexually transmitted infection)    -  Primary   Relevant Orders   Urine cytology ancillary only   RPR    HIV Antibody (routine testing w rflx)   Hepatitis C antibody       No orders of the defined types were placed in this encounter.   Return if symptoms worsen or fail to improve.  Everlina Hock, NP

## 2023-05-26 LAB — URINE CYTOLOGY ANCILLARY ONLY
Chlamydia: NEGATIVE
Comment: NEGATIVE
Comment: NEGATIVE
Comment: NORMAL
Neisseria Gonorrhea: NEGATIVE
Trichomonas: NEGATIVE

## 2023-05-26 LAB — RPR: RPR Ser Ql: NONREACTIVE

## 2023-05-26 LAB — HEPATITIS C ANTIBODY: Hepatitis C Ab: NONREACTIVE

## 2023-05-26 LAB — HIV ANTIBODY (ROUTINE TESTING W REFLEX): HIV 1&2 Ab, 4th Generation: NONREACTIVE

## 2023-06-01 ENCOUNTER — Other Ambulatory Visit: Payer: Self-pay

## 2023-06-01 ENCOUNTER — Encounter (HOSPITAL_BASED_OUTPATIENT_CLINIC_OR_DEPARTMENT_OTHER): Payer: Self-pay | Admitting: Emergency Medicine

## 2023-06-01 ENCOUNTER — Emergency Department (HOSPITAL_BASED_OUTPATIENT_CLINIC_OR_DEPARTMENT_OTHER)

## 2023-06-01 ENCOUNTER — Emergency Department (HOSPITAL_BASED_OUTPATIENT_CLINIC_OR_DEPARTMENT_OTHER)
Admission: EM | Admit: 2023-06-01 | Discharge: 2023-06-01 | Disposition: A | Discharge status: Home / Self Care | Attending: Emergency Medicine | Admitting: Emergency Medicine

## 2023-06-01 DIAGNOSIS — M25561 Pain in right knee: Secondary | ICD-10-CM | POA: Diagnosis present

## 2023-06-01 DIAGNOSIS — M791 Myalgia, unspecified site: Secondary | ICD-10-CM

## 2023-06-01 DIAGNOSIS — W1789XA Other fall from one level to another, initial encounter: Secondary | ICD-10-CM | POA: Insufficient documentation

## 2023-06-01 NOTE — ED Triage Notes (Signed)
 Pt POV staggered gait- reports jumping fence appx 6 ft earlier today and R knee buckled, fell and landed on bottom. C/o posterior knee pain, proximal posterior calf that radiates to ankle.  Denies swelling.

## 2023-06-01 NOTE — ED Provider Notes (Signed)
 [place] [facility] Provider Note   CSN: [id number] Arrival date & time: 06/01/23  2123     History Chief Complaint  Patient presents with   Fall    HPI Blake Baldwin is a 38 y.o. male presenting for chief complaint of right knee pain. States that's he has pretty severe pain in the lateral right knee Jumped a fence and landed on it off balance.  Patient's recorded medical, surgical, social, medication list and allergies were reviewed in the Snapshot window as part of the initial history.   Review of Systems   Review of Systems  Constitutional:  Negative for chills and fever.  HENT:  Negative for ear pain and sore throat.   Eyes:  Negative for pain and visual disturbance.  Respiratory:  Negative for cough and shortness of breath.   Cardiovascular:  Negative for chest pain and palpitations.  Gastrointestinal:  Negative for abdominal pain and vomiting.  Genitourinary:  Negative for dysuria and hematuria.  Musculoskeletal:  Negative for arthralgias and back pain.  Skin:  Negative for color change and rash.  Neurological:  Negative for seizures and syncope.  All other systems reviewed and are negative.   Physical Exam Updated Vital Signs BP (!) 146/95   Pulse 93   Temp 99 F (37.2 C) (Oral)   Resp 15   Ht 6' (1.829 m)   Wt 94.3 kg   SpO2 98%   BMI 28.21 kg/m  Physical Exam Vitals and nursing note reviewed.  Constitutional:      General: He is not in acute distress.    Appearance: He is well-developed.  HENT:     Head: Normocephalic and atraumatic.  Eyes:     Conjunctiva/sclera: Conjunctivae normal.  Cardiovascular:     Rate and Rhythm: Normal rate and regular rhythm.     Heart sounds: No murmur heard. Pulmonary:     Effort: Pulmonary effort is normal. No respiratory distress.     Breath sounds: Normal breath sounds.  Abdominal:     Palpations: Abdomen is soft.     Tenderness: There is no abdominal tenderness.   Musculoskeletal:        General: No swelling.     Cervical back: Neck supple.  Skin:    General: Skin is warm and dry.     Capillary Refill: Capillary refill takes less than 2 seconds.  Neurological:     Mental Status: He is alert.  Psychiatric:        Mood and Affect: Mood normal.      ED Course/ Medical Decision Making/ A&P    Procedures Procedures   Medications Ordered in ED Medications - No data to display  Medical Decision Making:   This is a 38 year old male presenting with right knee pain after jumping over a fence. States he landed right on the leg.  Felt it buckle.  Immediate pain at the site.  Worsening into the evening.  Denies fevers chills nausea vomiting shortness of breath.  He is able to ambulate though with difficulty. X-ray shows no acute fracture. More likely to be ligamentous strain based on mechanism.  Educated on appropriate management strict return precautions reinforced.  Supportive care and recommended follow-up with primary care/orthopedics in the outpatient setting. Disposition:  I have considered need for hospitalization, however, considering all of the above, I believe this patient is stable for discharge at this time.  Patient/family educated about specific return precautions for given chief complaint and symptoms.  Patient/family educated about follow-up with PCP.    Patient/family expressed understanding of return precautions and need for follow-up. Patient spoken to regarding all imaging and laboratory results and appropriate follow up for these results. All education provided in verbal form with additional information in written form. Time was allowed for answering of patient questions. Patient discharged.    Emergency Department Medication Summary:   Medications - No data to display       Clinical Impression: No diagnosis found.   Data Unavailable   Final Clinical Impression(s) / ED Diagnoses Final diagnoses:  None    Rx / DC  Orders ED Discharge Orders     None         Onetha Bile, MD 06/02/23 [phone]

## 2023-06-06 ENCOUNTER — Ambulatory Visit: Payer: Self-pay | Admitting: Family Medicine

## 2023-06-07 ENCOUNTER — Encounter: Admitting: Family

## 2023-06-15 ENCOUNTER — Other Ambulatory Visit (HOSPITAL_BASED_OUTPATIENT_CLINIC_OR_DEPARTMENT_OTHER): Payer: Self-pay

## 2023-07-13 ENCOUNTER — Other Ambulatory Visit (HOSPITAL_BASED_OUTPATIENT_CLINIC_OR_DEPARTMENT_OTHER): Payer: Self-pay

## 2023-08-10 ENCOUNTER — Ambulatory Visit: Admitting: Physician Assistant

## 2023-08-10 ENCOUNTER — Encounter: Payer: Self-pay | Admitting: Physician Assistant

## 2023-08-10 ENCOUNTER — Other Ambulatory Visit (HOSPITAL_BASED_OUTPATIENT_CLINIC_OR_DEPARTMENT_OTHER): Payer: Self-pay

## 2023-08-10 VITALS — BP 129/75 | HR 83 | Ht 72.0 in | Wt 216.8 lb

## 2023-08-10 DIAGNOSIS — B356 Tinea cruris: Secondary | ICD-10-CM | POA: Diagnosis not present

## 2023-08-10 MED ORDER — FLUCONAZOLE 150 MG PO TABS
150.0000 mg | ORAL_TABLET | Freq: Once | ORAL | 0 refills | Status: AC
  Filled 2023-08-10: qty 2, 2d supply, fill #0

## 2023-08-10 MED ORDER — KETOCONAZOLE 2 % EX CREA
1.0000 | TOPICAL_CREAM | Freq: Every day | CUTANEOUS | 0 refills | Status: AC
  Filled 2023-08-10: qty 30, 30d supply, fill #0

## 2023-08-10 NOTE — Progress Notes (Signed)
      Established patient visit   Patient: Blake Baldwin   DOB: July 04, 1985   38 y.o. Male  MRN: [id number] Visit Date: 08/10/2023  Today's healthcare provider: Manuelita Flatness, PA-C   Cc. rash Subjective     Pt reports for the last few days a red, inflamed itchy rash in his groin. He works in a [employer] and sweats all day, works 12 hours shifts with a limited break. Unable to wear powders/creams as he sweats so much.  Medications: Outpatient Medications Prior to Visit  Medication Sig   omeprazole  (PRILOSEC ) 40 MG capsule Take 1 capsule (40 mg total) by mouth daily.   No facility-administered medications prior to visit.    Review of Systems  Constitutional:  Negative for fatigue and fever.  Respiratory:  Negative for cough and shortness of breath.   Cardiovascular:  Negative for chest pain, palpitations and leg swelling.  Neurological:  Negative for dizziness and headaches.       Objective    BP 129/75   Pulse 83   Ht 6' (1.829 m)   Wt 216 lb 12.8 oz (98.3 kg)   BMI 29.40 kg/m    Physical Exam Vitals reviewed.  Constitutional:      Appearance: He is not ill-appearing.  HENT:     Head: Normocephalic.  Eyes:     Conjunctiva/sclera: Conjunctivae normal.  Cardiovascular:     Rate and Rhythm: Normal rate.  Pulmonary:     Effort: Pulmonary effort is normal. No respiratory distress.  Skin:    Comments: Groin w/ an inflamed erythematous rash  Neurological:     Mental Status: He is alert and oriented to person, place, and time.  Psychiatric:        Mood and Affect: Mood normal.        Behavior: Behavior normal.      No results found for any visits on 08/10/23.  Assessment & Plan    Tinea cruris -     Ketoconazole ; Apply 1 Application topically daily.  Dispense: 30 g; Refill: 0 -     Fluconazole ; Take 1 tablet (150 mg total) by mouth once for 1 dose. If symptoms persist you can take a second dose in 72 hours  Dispense: 2 tablet; Refill: 0   Rx  ketoconazole  to use topically on days off.  Given discomfort level an inability to use topicals during the work day, rx diflucan , explained second dose if necessary . Recommend staying clean/dry, bringing change of underpants to work  Return if symptoms worsen or fail to improve.       Manuelita Flatness, PA-C  [facility] [phone] (phone) [phone] (fax)  [facility]

## 2023-08-15 ENCOUNTER — Other Ambulatory Visit (HOSPITAL_BASED_OUTPATIENT_CLINIC_OR_DEPARTMENT_OTHER): Payer: Self-pay

## 2023-09-14 ENCOUNTER — Other Ambulatory Visit (HOSPITAL_BASED_OUTPATIENT_CLINIC_OR_DEPARTMENT_OTHER): Payer: Self-pay

## 2023-10-11 ENCOUNTER — Other Ambulatory Visit (HOSPITAL_BASED_OUTPATIENT_CLINIC_OR_DEPARTMENT_OTHER): Payer: Self-pay

## 2023-10-12 ENCOUNTER — Other Ambulatory Visit (HOSPITAL_BASED_OUTPATIENT_CLINIC_OR_DEPARTMENT_OTHER): Payer: Self-pay

## 2023-11-09 ENCOUNTER — Other Ambulatory Visit (HOSPITAL_BASED_OUTPATIENT_CLINIC_OR_DEPARTMENT_OTHER): Payer: Self-pay

## 2023-12-05 ENCOUNTER — Encounter (HOSPITAL_BASED_OUTPATIENT_CLINIC_OR_DEPARTMENT_OTHER): Payer: Self-pay | Admitting: Emergency Medicine

## 2023-12-05 ENCOUNTER — Other Ambulatory Visit: Payer: Self-pay

## 2023-12-05 ENCOUNTER — Emergency Department (HOSPITAL_BASED_OUTPATIENT_CLINIC_OR_DEPARTMENT_OTHER)

## 2023-12-05 ENCOUNTER — Emergency Department (HOSPITAL_BASED_OUTPATIENT_CLINIC_OR_DEPARTMENT_OTHER)
Admission: EM | Admit: 2023-12-05 | Discharge: 2023-12-05 | Disposition: A | Discharge status: Home / Self Care | Attending: Emergency Medicine | Admitting: Emergency Medicine

## 2023-12-05 DIAGNOSIS — M79672 Pain in left foot: Secondary | ICD-10-CM | POA: Diagnosis present

## 2023-12-05 NOTE — ED Triage Notes (Signed)
 Pt c/o L foot pain, with redness x 1 day.  Denies known injury.

## 2023-12-05 NOTE — ED Provider Notes (Signed)
  [facility] AT [facility] Provider Note   CSN: [id number] Arrival date & time: 12/05/23  1300     Patient presents with: Foot Pain   Blake Baldwin is a 38 y.o. male who presents to  the emergency department with a chief complaint of left foot pain.  Patient denies known trauma or injury.  Patient states that he was walking at work yesterday and his left lateral foot began to bother him.  Patient states he has also noticed a mild amount of redness in this area.  Denies falls, history of gout.  Denies previous open wound or injury to this area.  Denies systemic symptoms including fever or chills.  Patient states that he has previously injured this foot with a ligament injury.  Patient takes no prescription medications at home.  Past medical history significant for GERD, tobacco abuse, thrombocytopenia, bronchitis, gallstones, appendectomy, cholecystectomy.    Foot Pain       Prior to Admission medications   Medication Sig Start Date End Date Taking? Authorizing Provider  ketoconazole  (NIZORAL ) 2 % cream Apply 1 Application topically daily. 08/10/23   Cyndi Shaver, PA-C  omeprazole  (PRILOSEC ) 40 MG capsule Take 1 capsule (40 mg total) by mouth daily. 01/02/23   O'Sullivan, [name], NP    Allergies: Diclofenac  and Dilaudid [hydromorphone hcl]    Review of Systems  Musculoskeletal:  Positive for arthralgias (L foot pain).    Updated Vital Signs BP (!) 141/96 (BP Location: Right Arm)   Pulse 89   Temp 98.1 F (36.7 C) (Oral)   Resp 16   Ht 6' (1.829 m)   Wt 94.8 kg   SpO2 97%   BMI 28.35 kg/m   Physical Exam Vitals and nursing note reviewed.  Constitutional:      General: He is not in acute distress.    Appearance: Normal appearance. He is not ill-appearing, toxic-appearing or diaphoretic.  HENT:     Head: Normocephalic and atraumatic.  Eyes:     General: No scleral icterus. Pulmonary:     Effort: Pulmonary effort is normal. No  respiratory distress.  Musculoskeletal:        General: Normal range of motion.     Right lower leg: No edema.     Left lower leg: No edema.     Comments: Normal range of motion of left foot and left ankle, patient able to plantar and dorsiflex against gravity as well as resistance, patient ambulatory without assistance, no tenderness with palpation of left foot or ankle other than on lateral side of foot just anterior to lateral malleolus, patient reports pain only occurs whenever walking but that it takes a few steps for it to start  LLE neurovascularly intact  Skin:    General: Skin is warm.     Capillary Refill: Capillary refill takes less than 2 seconds.     Comments: Mild redness present to left lateral dorsal foot just anterior to the lateral malleolus, no warmth appreciated, no open wounds, does not appear infectious  Neurological:     General: No focal deficit present.     Mental Status: He is alert and oriented to person, place, and time.  Psychiatric:        Mood and Affect: Mood normal.        Behavior: Behavior normal.     (all labs ordered are listed, but only abnormal results are displayed) Labs Reviewed - No data to display  EKG: None  Radiology: DG Foot  Complete Left Result Date: 12/05/2023 CLINICAL DATA:  Left foot pain and erythema across the fourth and fifth metatarsals EXAM: LEFT FOOT - COMPLETE 3+ VIEW COMPARISON:  None Available. FINDINGS: Frontal, oblique, and lateral views of the left foot are obtained. No acute fracture, subluxation, or dislocation. Joint spaces are well preserved. Soft tissues are unremarkable. IMPRESSION: 1. Unremarkable left foot. Electronically Signed   By: Ozell Daring M.D.   On: 12/05/2023 15:14     Procedures   Medications Ordered in the ED - No data to display                                  Medical Decision Making Amount and/or Complexity of Data Reviewed Radiology: ordered.    Patient presents to the ED for concern  of foot pain, this involves an extensive number of treatment options, and is a complaint that carries with it a high risk of complications and morbidity.  The differential diagnosis includes fracture, dislocation, inflammatory arthritis, arthritis, soft tissue injury, sprain/strain, ligamentous injury, ischemic limb, cellulitis, etc.   Co morbidities that complicate the patient evaluation  GERD, tobacco abuse, thrombocytopenia, bronchitis, gallstones, appendectomy, cholecystectomy   Imaging Studies ordered:  I ordered imaging studies including xray of left foot  I independently visualized and interpreted imaging which showed no acute osseous abnormality I agree with the radiologist interpretation   Medicines ordered and prescription drug management:  I have reviewed the patients home medicines and have made adjustments as needed   Test Considered:  none   Critical Interventions:  none   Problem List / ED Course:  38 year old male, vital signs stable, presents emergency department with a chief complaint of left foot pain, atraumatic in nature, does have mild redness on lateral side of left foot On physical exam mild redness is present however not consistent with infection, no warmth, no open wound, patient does not have systemic symptoms including fever or chills, foot is also only painful with movement making musculoskeletal pain more likely, would suspect pain at rest with inflammatory arthritis like gout or pseudogout Low clinical suspicion for acute life threatening process like ischemic limb, septic arthritis, presentation does not appear infectious in nature Xray today unremarkable and patient able to ambulate with normal ROM of LLE Due to reassuring exam and findings will treat with NSAIDs and give ortho follow-up info, instructed patient to watch for growth of red area on foot Return precautions given Patient discharged  Most likely diagnosis at this time is  musculoskeletal pain of the left foot   Reevaluation:  After the interventions noted above, I reevaluated the patient and found that they have :stayed the same   Social Determinants of Health:  none   Dispostion:  After consideration of the diagnostic results and the patients response to treatment, I feel that the patient would benefit from discharge and continue monitor symptoms, follow-up with orthopedics     Final diagnoses:  Left foot pain    ED Discharge Orders     None          Tessy Pawelski F, PA-C 12/05/23 1947    Bernard Drivers, MD 12/06/23 0740

## 2023-12-05 NOTE — Discharge Instructions (Signed)
 It was a pleasure taking care of you today.  Based on your history, physical exam, as well as your imaging I feel you are safe for discharge.  The x-rays of your left foot were negative for acute bony injury.  Your symptoms today are most consistent with musculoskeletal pain.  It is possible that this could be an overuse injury since you did not have direct trauma or injury.  I recommend over-the-counter anti-inflammatory medication like ibuprofen .  You may take up to 6 to 800 mg of ibuprofen  in a single dose to help with your symptoms.  Also recommend rest, ice, compression, as well as elevation.  I have attached the follow-up information for orthopedics, please make an appointment if symptoms persist or worsen.  Please continue to monitor the area of redness on the lateral side of your left foot, if this increases in size, becomes more inflamed, or begins to look infectious please return to the emergency department or seek further medical care.  If symptoms persist or worsen recommend follow-up within 1 week.

## 2023-12-13 ENCOUNTER — Other Ambulatory Visit (HOSPITAL_BASED_OUTPATIENT_CLINIC_OR_DEPARTMENT_OTHER): Payer: Self-pay

## 2024-01-09 ENCOUNTER — Other Ambulatory Visit (HOSPITAL_BASED_OUTPATIENT_CLINIC_OR_DEPARTMENT_OTHER): Payer: Self-pay

## 2024-01-09 ENCOUNTER — Other Ambulatory Visit: Payer: Self-pay | Admitting: Family

## 2024-01-09 DIAGNOSIS — K2100 Gastro-esophageal reflux disease with esophagitis, without bleeding: Secondary | ICD-10-CM

## 2024-01-09 MED ORDER — OMEPRAZOLE 40 MG PO CPDR
40.0000 mg | DELAYED_RELEASE_CAPSULE | Freq: Every day | ORAL | 0 refills | Status: DC
  Filled 2024-01-09: qty 30, 30d supply, fill #0

## 2024-01-12 ENCOUNTER — Other Ambulatory Visit (HOSPITAL_BASED_OUTPATIENT_CLINIC_OR_DEPARTMENT_OTHER): Payer: Self-pay

## 2024-01-25 ENCOUNTER — Ambulatory Visit: Payer: Self-pay | Admitting: Medical

## 2024-01-25 ENCOUNTER — Ambulatory Visit (HOSPITAL_BASED_OUTPATIENT_CLINIC_OR_DEPARTMENT_OTHER)
Admission: RE | Admit: 2024-01-25 | Discharge: 2024-01-25 | Disposition: A | Discharge status: Home / Self Care | Source: Ambulatory Visit | Attending: Medical | Admitting: Medical

## 2024-01-25 ENCOUNTER — Ambulatory Visit (INDEPENDENT_AMBULATORY_CARE_PROVIDER_SITE_OTHER): Admitting: Medical

## 2024-01-25 VITALS — BP 120/78 | HR 95 | Temp 98.0°F | Resp 16 | Ht 72.0 in | Wt 215.8 lb

## 2024-01-25 DIAGNOSIS — N50811 Right testicular pain: Secondary | ICD-10-CM | POA: Diagnosis not present

## 2024-01-25 DIAGNOSIS — N5089 Other specified disorders of the male genital organs: Secondary | ICD-10-CM | POA: Diagnosis not present

## 2024-01-25 DIAGNOSIS — F419 Anxiety disorder, unspecified: Secondary | ICD-10-CM | POA: Diagnosis not present

## 2024-01-25 NOTE — Patient Instructions (Addendum)
 Right testicular lump with pain Acute right testicular lump with pain. Differential includes epididymitis, varicocele, appendix of the testicle and  epididymal cyst.(doubt torsion but possible?)Unknown family history of testicular cancer. Anxiety about malignancy noted. Rule out CA. - Ordered scrotal ultrasound with Doppler, focusing on the right testicle. - Scheduled ultrasound as a stat order. - Will update via MyChart once results are available. -tried to call radiology dept on behalf of patient in light of his severe anxiety but no one picked up.  Anxiety  Anxiety exacerbated by testicular symptoms, causing significant distress and nausea. Pt concerned about malignancy. - Offer  medication for anxiety if needed after US  stat report  Follow up date to be determined after stat US .

## 2024-01-25 NOTE — Progress Notes (Signed)
" ° °  Subjective:    Patient ID: Blake Baldwin, male    DOB: 04/08/1985, 39 y.o.   MRN: [id number]  HPI Blake Baldwin is a 39 year old male who presents with a right-sided scrotal lump and discomfort/pain.  He noticed a marble-sized mass on the right side of his scrotum/testicle yesterday after a nap. It is tender to touch and causes a dull throb with occasional sharp pain radiating to the leg, worse when sitting or changing position, and absent when lying on his side. He denies penile discharge or left-sided scrotal changes and notes mild nausea he attributes to anxiety. He is unaware of any paternal family history of testicular cancer because his biological father was adopted. He has not used medications or had prior evaluation for this problem.   Review of Systems  Constitutional:  Negative for chills, fatigue and fever.  Respiratory:  Negative for cough, chest tightness and wheezing.   Cardiovascular:  Negative for chest pain and palpitations.  Gastrointestinal:  Negative for abdominal pain.  Genitourinary:  Negative for dysuria, frequency, hematuria, penile pain and urgency.       See hpi  Musculoskeletal:  Negative for back pain and myalgias.  Neurological:  Negative for dizziness, syncope, weakness and headaches.  Hematological:  Negative for adenopathy.  Psychiatric/Behavioral:  Negative for behavioral problems and confusion.        Objective:   Physical Exam General no acute distress though appears anxious about lump.(Report low level mild throb intermittent pain) Genital- no hernia felt. Rt testilce inferior portion feels like lump about size of marble. Difficult to palpate. Left testicle felt normal.        Assessment & Plan:  [phone]   Patient Instructions  Right testicular lump with pain Acute right testicular lump with pain. Differential includes epididymitis, varicocele, appendix of the testicle and  epididymal cyst.(doubt torsion but possible?)Unknown  family history of testicular cancer. Anxiety about malignancy noted. Rule out CA. - Ordered scrotal ultrasound with Doppler, focusing on the right testicle. - Scheduled ultrasound as a stat order. - Will update via MyChart once results are available.  Anxiety  Anxiety exacerbated by testicular symptoms, causing significant distress and nausea. Pt concerned about malignancy. - Offer  medication for anxiety if needed after US  stat report  Follow up date to be determined after stat US .    Dallas Maxwell, PA-C  "

## 2024-01-25 NOTE — Addendum Note (Signed)
 Addended by: DORINA DALLAS DORINA PA-C M on: 01/25/2024 03:56 PM   Modules accepted: Orders

## 2024-01-30 ENCOUNTER — Encounter: Payer: Self-pay | Admitting: Family

## 2024-01-30 NOTE — Progress Notes (Signed)
" ° °  01/31/2024 12:25 PM   Blake Baldwin 1985/09/07 [id number]  Referring provider: Daryl Setter, NP [id number], [address] [place],  [place] [address]  No chief complaint on file.   HPI:    PMH: Past Medical History:  Diagnosis Date   Bronchitis    Gallstones     Surgical History: Past Surgical History:  Procedure Laterality Date   APPENDECTOMY     CHOLECYSTECTOMY      Home Medications:  Allergies as of 01/31/2024       Reactions   Diclofenac     anxiety   Dilaudid [hydromorphone Hcl] Itching, Rash        Medication List        Accurate as of January 30, 2024 12:25 PM. If you have any questions, ask your nurse or doctor.          ketoconazole  2 % cream Commonly known as: NIZORAL  Apply 1 Application topically daily.   omeprazole  40 MG capsule Commonly known as: PRILOSEC  Take 1 capsule (40 mg total) by mouth daily. Needs appt        Allergies: Allergies[1]  Family History: Family History  Problem Relation Age of Onset   Diabetes Mother    Hypertension Mother    Other Mother        hyperaldosteronism and padgett's disease   Asthma Mother    Heart disease Maternal Aunt    Diabetes Maternal Aunt    Diabetes Maternal Uncle    Diabetes Cousin     Social History:  reports that he quit smoking about 8 years ago. His smoking use included cigarettes. He smoked an average of 0.5 packs per day. He has quit using smokeless tobacco. He reports current alcohol use. He reports that he does not use drugs.  ROS: All other review of systems were reviewed and are negative except what is noted above in HPI  Physical Exam: There were no vitals taken for this visit.  Constitutional:  Alert and oriented, No acute distress. HEENT: [place] AT, moist mucus membranes.  Trachea midline, no masses. Cardiovascular: No clubbing, cyanosis, or edema. Respiratory: Normal respiratory effort, no increased work of breathing. GI: No inguinal hernias GU:  Normal phallus. No masses/lesions on penis, testis, scrotum. Prostate ***g smooth no nodules no induration.  Lymph: No cervical or inguinal lymphadenopathy. Skin: No rashes, bruises or suspicious lesions. Neurologic: Grossly intact, no focal deficits, moving all 4 extremities. Psychiatric: Normal mood and affect.  Laboratory Data: Lab Results  Component Value Date   WBC 5.2 04/03/2023   HGB 15.8 04/03/2023   HCT 46.9 04/03/2023   MCV 93.2 04/03/2023   PLT 174.0 04/03/2023    Lab Results  Component Value Date   CREATININE 0.75 04/03/2023    No results found for: PSA  No results found for: TESTOSTERONE  No results found for: HGBA1C  Urinalysis   Pertinent Imaging: Scrotal ultrasound performed 01/25/2024.  Images reviewed.  Final diagnosis by radiologist: 1. No acute sonographic abnormality within the testicles; more specifically, no testicular mass, epididymo-orchitis, or changes of testicular torsion, at this time.    Assessment:    Plan:    There are no diagnoses linked to this encounter.  No follow-ups on file.  Garnette CHRISTELLA Shack, MD  [facility] [place]      [1]  Allergies Allergen Reactions   Diclofenac      anxiety   Dilaudid [Hydromorphone Hcl] Itching and Rash   "

## 2024-01-31 ENCOUNTER — Ambulatory Visit: Admitting: Urology

## 2024-01-31 ENCOUNTER — Encounter: Payer: Self-pay | Admitting: Urology

## 2024-01-31 ENCOUNTER — Other Ambulatory Visit (HOSPITAL_BASED_OUTPATIENT_CLINIC_OR_DEPARTMENT_OTHER): Payer: Self-pay

## 2024-01-31 VITALS — BP 131/91 | HR 93 | Ht 72.0 in | Wt 212.0 lb

## 2024-01-31 DIAGNOSIS — N451 Epididymitis: Secondary | ICD-10-CM

## 2024-01-31 DIAGNOSIS — N453 Epididymo-orchitis: Secondary | ICD-10-CM

## 2024-01-31 DIAGNOSIS — N509 Disorder of male genital organs, unspecified: Secondary | ICD-10-CM

## 2024-01-31 MED ORDER — DOXYCYCLINE HYCLATE 100 MG PO TABS
100.0000 mg | ORAL_TABLET | Freq: Two times a day (BID) | ORAL | 0 refills | Status: AC
  Filled 2024-01-31: qty 15, 8d supply, fill #0

## 2024-02-14 ENCOUNTER — Telehealth: Payer: Self-pay

## 2024-02-14 ENCOUNTER — Other Ambulatory Visit (HOSPITAL_BASED_OUTPATIENT_CLINIC_OR_DEPARTMENT_OTHER): Payer: Self-pay

## 2024-02-14 DIAGNOSIS — K2100 Gastro-esophageal reflux disease with esophagitis, without bleeding: Secondary | ICD-10-CM

## 2024-02-14 MED ORDER — OMEPRAZOLE 40 MG PO CPDR
40.0000 mg | DELAYED_RELEASE_CAPSULE | Freq: Every day | ORAL | 0 refills | Status: AC
  Filled 2024-02-14: qty 30, 30d supply, fill #0

## 2024-02-14 NOTE — Telephone Encounter (Signed)
 Copied from CRM [id number]. Topic: Clinical - Medication Question >> Feb 14, 2024  9:17 AM Tonda B wrote: Reason for CRM: patient wants to know would he be able to get his rx omeprazole  (PRILOSEC ) 40 MG capsule without coming in for an appointment please call pt back [phone]

## 2024-02-14 NOTE — Telephone Encounter (Signed)
 Last OV w/ PCP 12/2022. Please advise?

## 2024-02-14 NOTE — Telephone Encounter (Signed)
 Called patient to get him schedule, no answer and voice mail not set up yet

## 2024-02-14 NOTE — Telephone Encounter (Signed)
My chart message sent to patient to call and schedule physical

## 2024-02-14 NOTE — Telephone Encounter (Signed)
 I will send a 90 day supply, but he is overdue for a physical.

## 2024-03-06 ENCOUNTER — Ambulatory Visit: Admitting: Urology
